# Patient Record
Sex: Female | Born: 1945
Health system: Southern US, Community
[De-identification: ages and names within clinical notes are randomized; demographics above are authoritative.]

## PROBLEM LIST (undated history)

## (undated) DIAGNOSIS — I10 Essential (primary) hypertension: Secondary | ICD-10-CM

## (undated) DIAGNOSIS — E559 Vitamin D deficiency, unspecified: Secondary | ICD-10-CM

## (undated) DIAGNOSIS — R208 Other disturbances of skin sensation: Secondary | ICD-10-CM

## (undated) DIAGNOSIS — K219 Gastro-esophageal reflux disease without esophagitis: Secondary | ICD-10-CM

## (undated) HISTORY — DX: Gastro-esophageal reflux disease without esophagitis: K21.9

## (undated) HISTORY — PX: CHOLECYSTECTOMY: SHX55

## (undated) HISTORY — PX: CARDIAC CATHETERIZATION: SHX172

## (undated) HISTORY — DX: Vitamin D deficiency, unspecified: E55.9

## (undated) HISTORY — DX: Other disturbances of skin sensation: R20.8

---

## 2007-08-03 ENCOUNTER — Ambulatory Visit: Payer: Self-pay | Admitting: Internal Medicine

## 2007-08-14 ENCOUNTER — Ambulatory Visit (HOSPITAL_COMMUNITY): Admission: RE | Admit: 2007-08-14 | Discharge: 2007-08-14 | Payer: Self-pay | Admitting: Gastroenterology

## 2007-08-14 ENCOUNTER — Ambulatory Visit: Payer: Self-pay | Admitting: Gastroenterology

## 2007-08-14 ENCOUNTER — Encounter: Payer: Self-pay | Admitting: Gastroenterology

## 2007-08-14 HISTORY — PX: COLONOSCOPY: SHX174

## 2007-08-14 HISTORY — PX: ESOPHAGOGASTRODUODENOSCOPY: SHX1529

## 2007-09-25 ENCOUNTER — Ambulatory Visit: Payer: Self-pay | Admitting: Gastroenterology

## 2009-12-28 ENCOUNTER — Ambulatory Visit (HOSPITAL_COMMUNITY): Admission: RE | Admit: 2009-12-28 | Discharge: 2009-12-28 | Payer: Self-pay | Admitting: Internal Medicine

## 2010-02-05 ENCOUNTER — Ambulatory Visit (HOSPITAL_COMMUNITY): Admission: RE | Admit: 2010-02-05 | Discharge: 2010-02-05 | Payer: Self-pay | Admitting: Family Medicine

## 2011-02-01 NOTE — Consult Note (Signed)
NAME:  Anna Gibson, Anna Gibson                 ACCOUNT NO.:  [id number]   MEDICAL RECORD NO.:  [id number]          PATIENT TYPE:  AMB   LOCATION:  DAY                           FACILITY:  [facility]   PHYSICIAN:  R. Roetta Sessions, M.D. DATE OF BIRTH:  [birth year]   DATE OF CONSULTATION:  08/03/2007  DATE OF DISCHARGE:                                 CONSULTATION   PHYSICIAN REQUESTING CONSULTATION:  Yetta Numbers, MD.   REASON FOR CONSULTATION:  Abdominal pain, diarrhea, GERD.   HISTORY OF PRESENT ILLNESS:  Patient is a 65 year old Caucasian female  who presents for further evaluation of above-stated symptoms.  She  states she has over a 35-year history of chronic intermittent diarrhea.  Stools have altered between diarrhea and constipation for years.  She  has a long history of postprandial loose stools.  She also has a history  of having diarrhea with stress.  She states over the past several  months, however, her symptoms have been progressively worse.  She is  getting to the point she is afraid to go out.  She is having 5 and 6  stools a day.  Denies any nocturnal diarrhea.  Denies any melena or  rectal bleeding.  Her husband notes that if she is stressed out she  tends to have an upset stomach and has diarrhea.  He also states after  eating lots of tomatoes over the summer her symptoms seem to be worse.  There are certain restaurants that she cannot eat at without having to  run and have diarrhea.  She recently tried Bentyl which worked for a  couple of days but then she says her symptoms got worse so she stopped  it.  She also has chronic GERD.  She previously did very well on  Prilosec but then lately she has noticed it was causing her stomach to  hurt and she stopped the medication.  Previously, she had diarrhea with  Nexium.  She took Zegerid for a few days last week and really did not  notice any improvement of her GERD and states it caused her to have  abdominal pain.  In the past she took  Prevacid without any problems.  She also did well on Aciphex in the past.  She complains of dysphagia  and odynophagia at times.  Not particularly related to any certain food.  Denies any nausea or vomiting or unintentional weight loss.  She has  never had an EGD or colonoscopy.  She collected stools for studies and  turned them in yesterday but results are pending.  Back in August she  had labs with a white count of 3500, otherwise CBC was normal, LFTs, MET  7 were normal, TSH was 3.206, normal.   CURRENT MEDICATIONS:  1. Ziac 5 mg daily.  2. Imodium p.r.n.   ALLERGIES:  INDOCIN.   PAST MEDICAL HISTORY:  1. Hypertension.  2. Osteoarthritis.  3. Chronic GERD.  4. Cesarean section x2.  5. Cholecystectomy in 2001.   FAMILY HISTORY:  1. Mother 62, she has heart disease and stomach problems including  hernias and has had her gallbladder removed.  2. Her sister had bladder cancer.  3. Fraternal grandmother had colon cancer in her 9s or 72s and      succumbed to other illnesses at age  81.   SOCIAL HISTORY:  1. She is married.  2. She has 2 children.  3. 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  4. Never been a smoker.  5. No alcohol use.   REVIEW OF SYSTEMS:  See HPI for GI.  CONSTITUTIONAL AND CARDIOPULMONARY:  Denies chest pain or shortness of breath.   PHYSICAL EXAM:  Weight 165, height 5 feet 4 inches, temp 98.4, blood  pressure 120/88, pulse 60.  GENERAL:  A pleasant, well-nourished, well-developed Caucasian female in  no acute distress.  Skin warm and dry, no jaundice.  HEENT:  Sclera nonicteric, oropharyngeal mucosa moist and pink, no  lesions, erythema or exudate.  No lymphadenopathy or thyromegaly.  CHEST:  Lungs clear to auscultation.  CARDIAC EXAM:  A regular rate and rhythm, normal S1 S2, no murmurs, rubs  or gallops.  ABDOMEN:  Positive bowel sounds.  Abdomen soft, nondistended.  She has  some fullness just  below the umbilicus in right of midline but no  discrete mass noted, it is somewhat tender in this area.  No  organomegaly, abdominal bruits or hernias.  LOWER EXTREMITIES:  No edema.   IMPRESSION:  Anna Gibson is a 65 year old lady with chronic intermittent  diarrhea with alternating constipation at times.  Lately her diarrhea  has been worse.  Symptoms are definitely worse postprandially and with  stress.  She has had worsening diarrhea with certain proton pump  inhibitors including Nexium and Zegerid.  She has chronic  gastroesophageal reflux disease which is well controlled on Prilosec,  but she recently stopped this due to abdominal pain.  Suspect we may be  dealing with irritable bowel syndrome; however, given that her symptoms  have been progressively worsened, we need to consider other etiologies  including irritable bowel disease, celiac disease, colorectal cancer,  infectious colitis.  She complains of dysphagia, odynophagia and may  develop some esophageal stricture.  On exam she has some fullness just  right of midline below the umbilicus.  No discrete mass.  Will have Dr.  Jena Gauss examine the patient and make further recommendations, especially  if her procedures are unremarkable.   PLAN:  1. Colonoscopy and EGD with Dr. Jena Gauss in the near future.  2. Patient to be examined by Dr. Jena Gauss at time of endoscopy and      further recommendations to be made whether or not she needs to have      a CT of the abdomen to further evaluate fullness in the abdomen as      noted above.  3. Prevacid 30 mg daily, #20, samples given.  4. Will retrieve stool study results as they are available next week.  5. She may continue to use Imodium 2 mg t.i.d. p.r.n. for now.   I would like to thank Dr. Yetta Numbers for allowing [place] to take part in  the care of this patient.      Tana Coast, P.AJonathon Bellows, M.D.  Electronically Signed    LL/MEDQ  D:  08/03/2007  T:  08/03/2007   Job:  [id number]   cc:   Kirk Ruths, M.D.  Fax: [phone]

## 2011-02-01 NOTE — Assessment & Plan Note (Signed)
NAMEHONORA, Anna Gibson                    CHART#:  [id number]   DATE:  09/25/2007                       DOB:  1946-04-21   REFERRING PHYSICIAN:  Kirk Ruths, M.D.   PROBLEM LIST:  1. Abdominal pain and diarrhea likely secondary to irritable bowel      syndrome.  2. Hypertension.  3. Cholecystectomy.   SUBJECTIVE:  Anna Gibson is a 65 year old female who presents as a return  patient visit.  She was last seen in 07/2007 for colonoscopy and an  upper endoscopy.  Colonoscopy only revealed diverticulosis and her upper  endoscopy revealed multiple benign polyps and no evidence of h.pylori  gastritis or celiac sprue. She previously had negative C. diff and  routine stool culture in 07/2007.  She believes she can control her  symptoms with management on her part.  She does notice that if she  goes long periods of time without eating her diarrhea is worse.  If she  eats just a bite during the daytime then she has no problems.  She has  had improvement in her abdominal pain since being on omeprazole.  She  has two bowel movements a day but her number of bowel movements per day  is influenced by what she eats.   MEDICATIONS:  1. Ziac 5 mg daily.  2. Omeprazole 20 mg daily.  3. Imodium as needed.   OBJECTIVE:  VITALS:  Weight 166 pounds (unchanged since 07/2007), height  5 feet 4 inches, body mass index 28.5 (overweight).  GENERAL: She is in no apparent distress. Alert and oriented x4.  LUNGS:  Clear to auscultation bilaterally.  CARDIOVASCULAR:  Regular rhythm, no murmur.  ABDOMEN:  Bowel sounds are present, soft, nontender, nondistended.   ASSESSMENT:  Anna Gibson is a 65 year old female with abdominal pain and  diarrhea.  The abdominal pain has resolved with Prilosec.  She did have  evidence of erosions in her antrum on the upper endoscopy.  Her diarrhea  is likely secondary to a functional gut disorder.   DISCHARGE DIAGNOSIS:  Includes a low likelihood of bowel salt induced  diarrhea.  Thank you for allowing me to see Anna Gibson in consultation.  My recommendations follow.   RECOMMENDATIONS:  1. Anna Gibson may use Yoplait daily or digestive advantage irritable      bowel syndrome.  She was given 16 samples.  2. She should continue the omeprazole.  3. She may follow-up with me as needed.       Kassie Mends, M.D.  Electronically Signed     SM/MEDQ  D:  09/25/2007  T:  09/26/2007  Job:  [id number]   cc:   Kirk Ruths, M.D.

## 2011-02-01 NOTE — Op Note (Signed)
NAMEDESMOND, TUFANO                 ACCOUNT NO.:  [id number]   MEDICAL RECORD NO.:  [id number]          PATIENT TYPE:  AMB   LOCATION:  DAY                           FACILITY:  [facility]   PHYSICIAN:  Kassie Mends, M.D.      DATE OF BIRTH:  1946-02-22   DATE OF PROCEDURE:  08/14/2007  DATE OF DISCHARGE:                               OPERATIVE REPORT   REFERRING PHYSICIAN:  Kirk Ruths, M.D.   PROCEDURE:  1. Colonoscopy with cold forceps biopsy.  2. Esophagogastroduodenoscopy with cold forceps biopsy.   INDICATION FOR EXAM:  Anna Gibson is a 65 year old female who presents  with chronic intermittent diarrhea.  She is also complaining of  abdominal pain which is new.  It started back in the summer when she was  eating lots of garden-fresh tomatoes.  She has been tried on Nexium and  Zegerid which did not improve her symptoms.  She has had these problems  in the past to control symptoms of gastroesophageal reflux disease, but  is not taking it regularly.   FINDINGS:  1. Rare sigmoid colon diverticula.  Random biopsies obtained in the      colon to evaluate for microscopic colitis.  Otherwise no masses,      inflammatory changes, AVMs, or internal hemorrhoids seen.  2. A 2 cm hiatal hernia.  Multiple sessile gastric polyp seen in the      fundus.  Biopsies obtained via cold forceps.  3. Multiple erosions seen in the antrum with evidence of old blood.      No ulcerations.  Biopsies obtained to evaluate for H. pylori      gastritis.  4. Normal duodenum.  Biopsies obtained to evaluate for celiac sprue      due to her history of chronic diarrhea.   DIAGNOSIS:  Gastritis is the likely cause for her abdominal pain.   RECOMMENDATIONS:  1. Continue Prilosec daily.  She is given a prescription.  We will      call Ms. Ivie with the results of her biopsies.  2. No aspirin, NSAIDs or anticoagulation for 7 days.  3. She should avoid gastric irritants.  She is given a handout on  gastric irritants and gastritis.  She should also follow a high      fiber diet.  She is given a handout on diverticulosis and a high      fiber diet.  4. She has a follow up appointment to see me in 6 weeks to reevaluate      her abdominal pain and diarrhea.  5. Screening colonoscopy in 10 years with osmo prep.  6. Will call with biopsy report. If symptoms not improved  at [facility] then      consider addition of anticholinergics.   PROCEDURE TECHNIQUE:  Physical exam was performed and informed consent  was obtained from the patient after explaining the benefits, risks, and  alternatives to the procedure.  The patient was connected to the  monitor, and placed in the left lateral position.  Continuous oxygen was  provided by nasal cannula and IV medicine  administered through an  indwelling cannula.  After administration of sedation and rectal exam,  the patient's rectum was intubated; and the scope was advanced under  direct visualization to cecum.  The scope was removed slowly by  carefully examining the color, texture, anatomy, and integrity mucosa on  the way out.   After the colonoscopy, the patient's esophagus was intubated with a  diagnostic gastroscope, and the scope was advanced under direct  visualization to the second portion of the duodenum.  The scope was  removed slowly by carefully examining the color, texture, anatomy, and  integrity of the mucosa on the way out.  The patient was recovered in  endoscopy and discharged home in satisfactory condition.      Kassie Mends, M.D.  Electronically Signed     SM/MEDQ  D:  08/14/2007  T:  08/14/2007  Job:  [id number]   cc:   Kirk Ruths, M.D.  Fax: [phone]

## 2011-09-29 ENCOUNTER — Encounter (HOSPITAL_COMMUNITY): Payer: Self-pay | Admitting: *Deleted

## 2011-09-29 ENCOUNTER — Emergency Department (HOSPITAL_COMMUNITY): Payer: Managed Care, Other (non HMO)

## 2011-09-29 ENCOUNTER — Encounter (HOSPITAL_COMMUNITY)
Admission: EM | Disposition: A | Discharge status: Home / Self Care | Payer: Self-pay | Source: Home / Self Care | Attending: Cardiovascular Disease

## 2011-09-29 ENCOUNTER — Other Ambulatory Visit: Payer: Self-pay

## 2011-09-29 ENCOUNTER — Inpatient Hospital Stay (HOSPITAL_COMMUNITY)
Admission: EM | Admit: 2011-09-29 | Discharge: 2011-09-30 | DRG: 287 | Disposition: A | Discharge status: Home / Self Care | Payer: Managed Care, Other (non HMO) | Attending: Cardiovascular Disease | Admitting: Cardiovascular Disease

## 2011-09-29 DIAGNOSIS — R079 Chest pain, unspecified: Secondary | ICD-10-CM

## 2011-09-29 DIAGNOSIS — R0789 Other chest pain: Principal | ICD-10-CM | POA: Diagnosis present

## 2011-09-29 DIAGNOSIS — I10 Essential (primary) hypertension: Secondary | ICD-10-CM | POA: Diagnosis present

## 2011-09-29 DIAGNOSIS — I200 Unstable angina: Secondary | ICD-10-CM

## 2011-09-29 HISTORY — DX: Essential (primary) hypertension: I10

## 2011-09-29 HISTORY — PX: LEFT HEART CATHETERIZATION WITH CORONARY ANGIOGRAM: SHX5451

## 2011-09-29 HISTORY — DX: Chest pain, unspecified: R07.9

## 2011-09-29 LAB — COMPREHENSIVE METABOLIC PANEL
Albumin: 4.1 g/dL (ref 3.5–5.2)
BUN: 13 mg/dL (ref 6–23)
CO2: 27 mEq/L (ref 19–32)
Chloride: 102 mEq/L (ref 96–112)
Creatinine, Ser: 0.65 mg/dL (ref 0.50–1.10)
Glucose, Bld: 117 mg/dL — ABNORMAL HIGH (ref 70–99)
Sodium: 138 mEq/L (ref 135–145)

## 2011-09-29 LAB — HEMOGLOBIN A1C
Hgb A1c MFr Bld: 5.7 % — ABNORMAL HIGH (ref <5.7)
Mean Plasma Glucose: 117 mg/dL — ABNORMAL HIGH (ref <117)

## 2011-09-29 LAB — CBC
HCT: 36.6 % (ref 36.0–46.0)
MCH: 29.9 pg (ref 26.0–34.0)
MCHC: 33.6 g/dL (ref 30.0–36.0)
Platelets: 242 10*3/uL (ref 150–400)
RDW: 12.7 % (ref 11.5–15.5)
WBC: 4.1 10*3/uL (ref 4.0–10.5)

## 2011-09-29 LAB — LIPID PANEL: Total CHOL/HDL Ratio: 3.2 RATIO

## 2011-09-29 LAB — TSH: TSH: 2.333 u[IU]/mL (ref 0.350–4.500)

## 2011-09-29 LAB — PROTIME-INR
INR: 1.01 (ref 0.00–1.49)
Prothrombin Time: 13.5 seconds (ref 11.6–15.2)

## 2011-09-29 LAB — APTT: aPTT: 104 seconds — ABNORMAL HIGH (ref 24–37)

## 2011-09-29 LAB — MAGNESIUM: Magnesium: 1.4 mg/dL — ABNORMAL LOW (ref 1.5–2.5)

## 2011-09-29 LAB — CARDIAC PANEL(CRET KIN+CKTOT+MB+TROPI)
Relative Index: INVALID (ref 0.0–2.5)
Total CK: 68 U/L (ref 7–177)

## 2011-09-29 SURGERY — LEFT HEART CATHETERIZATION WITH CORONARY ANGIOGRAM
Anesthesia: LOCAL

## 2011-09-29 MED ORDER — MORPHINE SULFATE 4 MG/ML IJ SOLN
4.0000 mg | Freq: Once | INTRAMUSCULAR | Status: AC
  Administered 2011-09-29: 4 mg via INTRAVENOUS

## 2011-09-29 MED ORDER — ROSUVASTATIN CALCIUM 10 MG PO TABS
10.0000 mg | ORAL_TABLET | Freq: Every day | ORAL | Status: DC
  Filled 2011-09-29: qty 1

## 2011-09-29 MED ORDER — SODIUM CHLORIDE 0.9 % IV SOLN: 250.0000 mL | INTRAVENOUS | Status: DC | PRN

## 2011-09-29 MED ORDER — ONDANSETRON HCL 4 MG/2ML IJ SOLN
INTRAMUSCULAR | Status: AC
  Filled 2011-09-29: qty 2

## 2011-09-29 MED ORDER — ALPRAZOLAM 0.25 MG PO TABS: 0.2500 mg | ORAL_TABLET | Freq: Two times a day (BID) | ORAL | Status: DC | PRN

## 2011-09-29 MED ORDER — ONDANSETRON HCL 4 MG/2ML IJ SOLN: 4.0000 mg | Freq: Four times a day (QID) | INTRAMUSCULAR | Status: DC | PRN

## 2011-09-29 MED ORDER — ACETAMINOPHEN 325 MG PO TABS: 650.0000 mg | ORAL_TABLET | ORAL | Status: DC | PRN

## 2011-09-29 MED ORDER — PROMETHAZINE HCL 25 MG/ML IJ SOLN
12.5000 mg | Freq: Four times a day (QID) | INTRAMUSCULAR | Status: DC | PRN
  Administered 2011-09-29: 12.5 mg via INTRAVENOUS
  Filled 2011-09-29 (×2): qty 1

## 2011-09-29 MED ORDER — NITROGLYCERIN IN D5W 200-5 MCG/ML-% IV SOLN
5.0000 ug/min | Freq: Once | INTRAVENOUS | Status: AC
  Administered 2011-09-29: 5 ug/min via INTRAVENOUS
  Filled 2011-09-29: qty 250

## 2011-09-29 MED ORDER — LIDOCAINE HCL (PF) 1 % IJ SOLN
INTRAMUSCULAR | Status: AC
  Filled 2011-09-29: qty 30

## 2011-09-29 MED ORDER — SODIUM CHLORIDE 0.9 % IV SOLN
INTRAVENOUS | Status: DC
  Administered 2011-09-29: 10:00:00 via INTRAVENOUS

## 2011-09-29 MED ORDER — ASPIRIN 325 MG PO TABS
325.0000 mg | ORAL_TABLET | Freq: Once | ORAL | Status: AC
  Administered 2011-09-29: 325 mg via ORAL
  Filled 2011-09-29: qty 1

## 2011-09-29 MED ORDER — MORPHINE SULFATE 4 MG/ML IJ SOLN
4.0000 mg | Freq: Once | INTRAMUSCULAR | Status: AC
  Administered 2011-09-29: 4 mg via INTRAVENOUS
  Filled 2011-09-29: qty 1

## 2011-09-29 MED ORDER — ASPIRIN EC 81 MG PO TBEC: 81.0000 mg | DELAYED_RELEASE_TABLET | Freq: Every day | ORAL | Status: DC

## 2011-09-29 MED ORDER — MAGNESIUM SULFATE 40 MG/ML IJ SOLN
2.0000 g | Freq: Once | INTRAMUSCULAR | Status: AC
  Administered 2011-09-29: 2 g via INTRAVENOUS
  Filled 2011-09-29: qty 50

## 2011-09-29 MED ORDER — NITROGLYCERIN IN D5W 200-5 MCG/ML-% IV SOLN: 3.0000 ug/min | INTRAVENOUS | Status: DC

## 2011-09-29 MED ORDER — HEPARIN SOD (PORCINE) IN D5W 100 UNIT/ML IV SOLN
INTRAVENOUS | Status: AC
  Filled 2011-09-29: qty 250

## 2011-09-29 MED ORDER — ASPIRIN 81 MG PO CHEW
324.0000 mg | CHEWABLE_TABLET | ORAL | Status: AC
  Administered 2011-09-29: 324 mg via ORAL
  Filled 2011-09-29: qty 4

## 2011-09-29 MED ORDER — ACETAMINOPHEN 325 MG PO TABS
650.0000 mg | ORAL_TABLET | ORAL | Status: DC | PRN
  Administered 2011-09-29 – 2011-09-30 (×2): 650 mg via ORAL
  Filled 2011-09-29 (×2): qty 2

## 2011-09-29 MED ORDER — HEPARIN SOD (PORCINE) IN D5W 100 UNIT/ML IV SOLN
1050.0000 [IU]/h | INTRAVENOUS | Status: DC
  Filled 2011-09-29: qty 250

## 2011-09-29 MED ORDER — DIAZEPAM 5 MG PO TABS: 5.0000 mg | ORAL_TABLET | ORAL | Status: DC

## 2011-09-29 MED ORDER — BUTALBITAL-APAP-CAFFEINE 50-325-40 MG PO TABS
1.0000 | ORAL_TABLET | ORAL | Status: DC | PRN
  Filled 2011-09-29: qty 1

## 2011-09-29 MED ORDER — NITROGLYCERIN 0.2 MG/ML ON CALL CATH LAB
INTRAVENOUS | Status: AC
  Filled 2011-09-29: qty 1

## 2011-09-29 MED ORDER — MORPHINE SULFATE 2 MG/ML IJ SOLN
2.0000 mg | INTRAMUSCULAR | Status: DC | PRN
  Administered 2011-09-29: 2 mg via INTRAVENOUS
  Filled 2011-09-29: qty 1

## 2011-09-29 MED ORDER — ZOLPIDEM TARTRATE 5 MG PO TABS: 10.0000 mg | ORAL_TABLET | Freq: Every evening | ORAL | Status: DC | PRN

## 2011-09-29 MED ORDER — HEPARIN BOLUS VIA INFUSION
4000.0000 [IU] | Freq: Once | INTRAVENOUS | Status: AC
  Administered 2011-09-29: 4000 [IU] via INTRAVENOUS

## 2011-09-29 MED ORDER — MORPHINE SULFATE 4 MG/ML IJ SOLN
INTRAMUSCULAR | Status: AC
  Administered 2011-09-29: 4 mg via INTRAVENOUS
  Filled 2011-09-29: qty 1

## 2011-09-29 MED ORDER — NITROGLYCERIN 0.4 MG SL SUBL: 0.4000 mg | SUBLINGUAL_TABLET | Freq: Once | SUBLINGUAL | Status: DC

## 2011-09-29 MED ORDER — ONDANSETRON HCL 4 MG/2ML IJ SOLN
4.0000 mg | Freq: Once | INTRAMUSCULAR | Status: AC
  Administered 2011-09-29: 4 mg via INTRAVENOUS

## 2011-09-29 MED ORDER — HEPARIN (PORCINE) IN NACL 100-0.45 UNIT/ML-% IJ SOLN
14.0000 [IU]/kg/h | Freq: Once | INTRAMUSCULAR | Status: AC
  Administered 2011-09-29: 14 [IU]/kg/h via INTRAVENOUS

## 2011-09-29 MED ORDER — METOPROLOL TARTRATE 25 MG PO TABS
25.0000 mg | ORAL_TABLET | Freq: Every day | ORAL | Status: DC
  Administered 2011-09-29 – 2011-09-30 (×2): 25 mg via ORAL
  Filled 2011-09-29: qty 1

## 2011-09-29 MED ORDER — SODIUM CHLORIDE 0.9 % IJ SOLN: 3.0000 mL | Freq: Two times a day (BID) | INTRAMUSCULAR | Status: DC

## 2011-09-29 MED ORDER — ONDANSETRON HCL 4 MG/2ML IJ SOLN
4.0000 mg | Freq: Once | INTRAMUSCULAR | Status: AC
Start: 2011-09-29 — End: 2011-09-29
  Administered 2011-09-29: 4 mg via INTRAVENOUS

## 2011-09-29 MED ORDER — ASPIRIN 81 MG PO CHEW: 324.0000 mg | CHEWABLE_TABLET | ORAL | Status: DC

## 2011-09-29 MED ORDER — METOPROLOL TARTRATE 12.5 MG HALF TABLET
12.5000 mg | ORAL_TABLET | Freq: Two times a day (BID) | ORAL | Status: DC
  Filled 2011-09-29 (×2): qty 1

## 2011-09-29 MED ORDER — SODIUM CHLORIDE 0.9 % IJ SOLN: 3.0000 mL | INTRAMUSCULAR | Status: DC | PRN

## 2011-09-29 MED ORDER — SODIUM CHLORIDE 0.9 % IV SOLN
INTRAVENOUS | Status: AC
  Administered 2011-09-29: 16:00:00 via INTRAVENOUS

## 2011-09-29 MED ORDER — HEPARIN (PORCINE) IN NACL 2-0.9 UNIT/ML-% IJ SOLN
INTRAMUSCULAR | Status: AC
  Filled 2011-09-29: qty 2000

## 2011-09-29 MED ORDER — ASPIRIN 300 MG RE SUPP
300.0000 mg | RECTAL | Status: DC
  Filled 2011-09-29: qty 1

## 2011-09-29 MED ORDER — ONDANSETRON HCL 4 MG/2ML IJ SOLN
4.0000 mg | Freq: Four times a day (QID) | INTRAMUSCULAR | Status: DC | PRN
  Administered 2011-09-29: 4 mg via INTRAVENOUS

## 2011-09-29 MED ORDER — ONDANSETRON HCL 4 MG/2ML IJ SOLN
INTRAMUSCULAR | Status: AC
  Administered 2011-09-29: 4 mg via INTRAVENOUS
  Filled 2011-09-29: qty 2

## 2011-09-29 MED ORDER — LORATADINE 10 MG PO TABS
10.0000 mg | ORAL_TABLET | Freq: Every day | ORAL | Status: DC
  Administered 2011-09-29 – 2011-09-30 (×2): 10 mg via ORAL
  Filled 2011-09-29 (×3): qty 1

## 2011-09-29 MED ORDER — NITROGLYCERIN 0.4 MG SL SUBL: 0.4000 mg | SUBLINGUAL_TABLET | SUBLINGUAL | Status: DC | PRN

## 2011-09-29 MED ORDER — PANTOPRAZOLE SODIUM 40 MG PO TBEC
40.0000 mg | DELAYED_RELEASE_TABLET | Freq: Every day | ORAL | Status: DC
  Administered 2011-09-29: 40 mg via ORAL
  Filled 2011-09-29: qty 1

## 2011-09-29 MED ORDER — NITROGLYCERIN 0.4 MG SL SUBL
0.4000 mg | SUBLINGUAL_TABLET | SUBLINGUAL | Status: DC | PRN
  Administered 2011-09-29: 0.4 mg via SUBLINGUAL
  Filled 2011-09-29: qty 25

## 2011-09-29 NOTE — Progress Notes (Signed)
ANTICOAGULATION CONSULT NOTE - Initial Consult  Pharmacy Consult for heparin Indication: UA  Allergies  Allergen Reactions  . Indocin     Patient Measurements: Height: 5\' 4"  (162.6 cm) Weight: 152 lb 1.9 oz (69 kg) IBW/kg (Calculated) : 54.7  Heparin wt = 68.6 Kg  Vital Signs: Temp: 98.1 F (36.7 C) (01/10 0436) Temp src: Oral (01/10 0436) BP: 129/77 mmHg (01/10 0900) Pulse Rate: 84  (01/10 0900)  Labs:  Basename 09/29/11 0507  HGB 12.3  HCT 36.6  PLT 242  APTT --  LABPROT 13.5  INR 1.01  HEPARINUNFRC --  CREATININE 0.65  CKTOTAL --  CKMB --  TROPONINI <0.30   Estimated Creatinine Clearance: 67.7 ml/min (by C-G formula based on Cr of 0.65).  Medical History: Past Medical History  Diagnosis Date  . Hypertension   . HTN (hypertension), though recently low BP 09/29/2011    Medications:  Scheduled:    . aspirin  324 mg Oral Pre-Cath  . aspirin EC  81 mg Oral Daily  . aspirin  325 mg Oral Once  . diazepam  5 mg Oral On Call  . heparin      . heparin  14 Units/kg/hr Intravenous Once  . heparin  4,000 Units Intravenous Once  . metoprolol tartrate  12.5 mg Oral BID  . morphine  4 mg Intravenous Once  . morphine  4 mg Intravenous Once  . nitroGLYCERIN  0.4 mg Sublingual Once  . nitroGLYCERIN  5-200 mcg/min Intravenous Once  . ondansetron      . ondansetron (ZOFRAN) IV  4 mg Intravenous Once  . ondansetron  4 mg Intravenous Once  . rosuvastatin  10 mg Oral q1800  . sodium chloride  3 mL Intravenous Q12H  . DISCONTD: aspirin  324 mg Oral NOW  . DISCONTD: aspirin  300 mg Rectal NOW    Assessment: 65 YOF tx from [facility] with chest pain and UA.  Was give heparin 4000 unit bolus and started at 1050 units/hr (at ~ 0520 this am). She is currently being prepped for the cath lab.    Goal of Therapy:  Heparin level 0.3-0.7 units/ml   Plan:  1. Although would have started her at 12 units/kg/hr, 15 units/kg/hr (1050 units/hr) is OK since next for cath. 2. F/u after  cath  Dannielle Huh 09/29/2011,10:10 AM

## 2011-09-29 NOTE — Consult Note (Signed)
  Pt was reexamined and existing H & P reviewed. No changes found.  [name], MD [facility] 09/29/2011 10:56 AM

## 2011-09-29 NOTE — ED Notes (Signed)
Pt vomiting at this time EDP made aware, orders received.

## 2011-09-29 NOTE — ED Notes (Signed)
Pt reports intermittent episodes of generalized cp for the past week, reports she saw PCP (Dr. Regino Schultze) last week and was in process of being referred to a cardiologist, pt reports pain got worse last night and has not stopped

## 2011-09-29 NOTE — ED Notes (Signed)
Pt reports having intermittent episodes of generalized chest pain for 1 week. Pt reports seeing PCP on Friday of last week and she was in the process of being referred to a cardiologist. Pt reports pain went away for approximately 3 hours last night and then came back and has became progressively worse. Pt describes the pain as a pressure stating that she feels "like something is sitting on my chest." pt denies any vomiting but states that she has felt nauseous. Pt also states that she has been stressed. Pt placed on cardiac monitor NSR at this time with no ectopy. Pt placed on 2L of oxygen. Pt alert and oriented at this time.

## 2011-09-29 NOTE — ED Provider Notes (Addendum)
History     CSN: [id number]  Arrival date & time 09/29/11  0425   First MD Initiated Contact with Patient 09/29/11 [phone]      Chief Complaint  Patient presents with  . Chest Pain     Patient is a 66 y.o. female presenting with chest pain.  Chest Pain    Patient reports one week of intermittent substernal chest pain/pressure that is worsened by exertion and walking around her house.  Now she reports she'll and walks a short distance before she develop shortness of breath and pressure in her chest.  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  She reports she has been unable to do this secondary to her discomfort. She has also had palpitations for several days.  She has no history of hyperlipidemia or diabetes.  She does have a history of hypertension.  She has a brother who reportedly has had several stents to his heart approximately 10-12 years ago.  She has no known history of coronary artery disease.  She reports she's never had these symptoms before.  She denies productive cough or fever or chills.  She denies melena or hematochezia.  At this time she is still having ongoing pressure in her chest has been going on for several hours.   Past Medical History  Diagnosis Date  . Hypertension     Past Surgical History  Procedure Date  . Cesarean section     No family history on file.  History  Substance Use Topics  . Smoking status: Never Smoker   . Smokeless tobacco: Not on file  . Alcohol Use: No    OB History    Grav Para Term Preterm Abortions TAB SAB Ect Mult Living                  Review of Systems  Cardiovascular: Positive for chest pain.  All other systems reviewed and are negative.    Allergies  Indocin  Home Medications  No current outpatient prescriptions on file.  BP 158/89  Pulse 72  Temp(Src) 98.1 F (36.7 C) (Oral)  Resp 18  Ht 5\' 4"  (1.626 m)  Wt 164 lb (74.39 kg)  BMI 28.15 kg/m2  SpO2  99%  Physical Exam  Nursing note and vitals reviewed. Constitutional: She is oriented to person, place, and time. She appears well-developed and well-nourished. No distress.  HENT:  Head: Normocephalic and atraumatic.  Eyes: EOM are normal.  Neck: Normal range of motion.  Cardiovascular: Normal rate, regular rhythm and normal heart sounds.   Pulmonary/Chest: Effort normal and breath sounds normal.  Abdominal: Soft. She exhibits no distension. There is no tenderness.  Musculoskeletal: Normal range of motion.  Neurological: She is alert and oriented to person, place, and time.  Skin: Skin is warm and dry.  Psychiatric: She has a normal mood and affect. Judgment normal.    ED Course  Procedures (including critical care time)   Date: 09/29/2011  Rate: 92  Rhythm: normal sinus rhythm  QRS Axis: normal  Intervals: normal  ST/T Wave abnormalities: nonspecific ST changes  Conduction Disutrbances:none  Narrative Interpretation:   Old EKG Reviewed: No prior ecg available  CRITICAL CARE Performed by: [name]  Total critical care time: 32  Critical care time was exclusive of separately billable procedures and treating other patients. Critical care was necessary to treat or prevent imminent or life-threatening deterioration. Critical care was time spent personally  by me on the following activities: development of treatment plan with patient and/or surrogate as well as nursing, discussions with consultants, evaluation of patient's response to treatment, examination of patient, obtaining history from patient or surrogate, ordering and performing treatments and interventions, ordering and review of laboratory studies, ordering and review of radiographic studies, pulse oximetry and re-evaluation of patient's condition.    Labs Reviewed  CBC  COMPREHENSIVE METABOLIC PANEL  TROPONIN I  PROTIME-INR   Dg Chest Portable 1 View  09/29/2011  *RADIOLOGY REPORT*  Clinical Data: Chest  pain for 1 week.  PORTABLE CHEST - 1 VIEW  Comparison: None.  Findings:  Cardiopericardial silhouette within normal limits. Mediastinal contours normal. Trachea midline.  No airspace disease or effusion. Monitoring leads are projected over the chest.  IMPRESSION: No active cardiopulmonary disease.  Original Report Authenticated By: Andreas Newport, M.D.   I personally reviewed the x-ray  1. Unstable angina       MDM  The patient's history is concerning for unstable angina.  And given an aspirin here in the nitroglycerin.  She'll be started on a heparin drip and nitroglycerin drip.  Labs and chest x-ray pending.  Her EKG is without significant ST changes.  5:56 AM Spoke with Karleen Hampshire of [facility] who accepts the patient in transfer to [facility] for Dr Allyson Sabal, cardiology.  Patient still having some pain at this time.  4 of IV morphine has been given and her nitroglycerin is being titrated up.  Her initial set of enzymes was normal  7:32 AM Patient reports her pain is much better at this time.  She's now complaining of mild headache.  Additional morphine given for her headache.  Her bed is ready and report his been called.  Awaiting transport by [facility] at this time     [name], MD 09/29/11 1610  [name], MD 09/29/11 [phone]

## 2011-09-29 NOTE — H&P (Signed)
Anna Gibson is an 66 y.o. female.    PRIMARY PHYSICIAN: Dr. Regino Schultze   Chief Complaint: chest pain. HPI:  66 YEAR OLD WMF, with no prior cardiac history, presented to [name] with chest pain.  Patient reports one week of intermittent substernal chest pain/pressure that is worsened by exertion and walking around her house. Now she reports she'll and walks a short distance before she develop shortness of breath and pressure in her chest. 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 She reports she has been unable to do this secondary to her discomfort. She has also had palpitations for several days. She has no history of hyperlipidemia or diabetes. She does have a history of hypertension. She has a brother who reportedly has had several stents to his heart approximately 10-12 years ago. She has no known history of coronary artery disease. She reports she's never had these symptoms before. She denies productive cough or fever or chills. She denies melena or hematochezia. At this time she is still having ongoing pressure in her chest has been going on for several hours.   Dr. Regino Schultze was to send her to cardiology but the symptoms increased before this was accomplished.  The symptoms increased last evening so she went to the ER.  Currently on IV Heparin and IV NTG, now with headache from the NTG.  EKG without acute changes, cardiac enzymes are negative currently.  + N&V possibly due to headache.  She denies any history of CVA.  No GI bleeding.    Past Medical History  Diagnosis Date  . Hypertension   . HTN (hypertension), though recently low BP 09/29/2011    Past Surgical History  Procedure Date  . Cesarean section   . Cesarean section     Family History  Problem Relation Age of Onset  . Congenital heart disease Mother   . Coronary artery disease Brother    Social History:  reports that she has never smoked. She has never used smokeless  tobacco. She reports that she does not drink alcohol or use illicit drugs.maried + children, works in a Public affairs consultant.  Allergies:  Allergies  Allergen Reactions  . Indocin     Medications Prior to Admission  Medication Dose Route Frequency Provider Last Rate Last Dose  . 0.9 %  sodium chloride infusion   Intravenous Continuous Leone Brand, NP 100 mL/hr at 09/29/11 0941    . 0.9 %  sodium chloride infusion  250 mL Intravenous PRN Leone Brand, NP      . acetaminophen (TYLENOL) tablet 650 mg  650 mg Oral Q4H PRN Leone Brand, NP      . ALPRAZolam Prudy Feeler) tablet 0.25 mg  0.25 mg Oral BID PRN Leone Brand, NP      . aspirin chewable tablet 324 mg  324 mg Oral NOW Leone Brand, NP       Or  . aspirin suppository 300 mg  300 mg Rectal NOW Leone Brand, NP      . aspirin EC tablet 81 mg  81 mg Oral Daily Leone Brand, NP      . aspirin tablet 325 mg  325 mg Oral Once [name], MD   325 mg at 09/29/11 0454  . heparin 100 UNIT/ML infusion           . heparin ADULT infusion 100 units/mL (25000 units/250 mL)  14 Units/kg/hr Intravenous Once [name], MD 10.5 mL/hr at 09/29/11 0521 14 Units/kg/hr at 09/29/11 0521  . heparin bolus via infusion 4,000 Units  4,000 Units Intravenous Once [name], MD   4,000 Units at 09/29/11 0520  . metoprolol tartrate (LOPRESSOR) tablet 12.5 mg  12.5 mg Oral BID Leone Brand, NP      . morphine 2 MG/ML injection 2 mg  2 mg Intravenous Q2H PRN Leone Brand, NP      . morphine 4 MG/ML injection 4 mg  4 mg Intravenous Once [name], MD   4 mg at 09/29/11 0547  . morphine 4 MG/ML injection 4 mg  4 mg Intravenous Once [name], MD   4 mg at 09/29/11 1610  . nitroGLYCERIN (NITROSTAT) SL tablet 0.4 mg  0.4 mg Sublingual Once [name], MD      . nitroGLYCERIN (NITROSTAT) SL tablet 0.4 mg  0.4 mg Sublingual Q5 min PRN Leone Brand, NP      . nitroGLYCERIN 0.2 mg/mL in dextrose 5 % infusion  5-200 mcg/min Intravenous Once  [name], MD 4.5 mL/hr at 09/29/11 0552 15 mcg/min at 09/29/11 0552  . nitroGLYCERIN 0.2 mg/mL in dextrose 5 % infusion  3-30 mcg/min Intravenous Titrated Leone Brand, NP      . ondansetron [employer]) 4 MG/2ML injection           . ondansetron (ZOFRAN) injection 4 mg  4 mg Intravenous Once [name], MD   4 mg at 09/29/11 9604  . ondansetron (ZOFRAN) injection 4 mg  4 mg Intravenous Once [name], MD   4 mg at 09/29/11 0731  . ondansetron (ZOFRAN) injection 4 mg  4 mg Intravenous Q6H PRN Leone Brand, NP      . rosuvastatin (CRESTOR) tablet 10 mg  10 mg Oral Daily Leone Brand, NP      . sodium chloride 0.9 % injection 3 mL  3 mL Intravenous Q12H Leone Brand, NP      . sodium chloride 0.9 % injection 3 mL  3 mL Intravenous PRN Leone Brand, NP      . zolpidem (AMBIEN) tablet 10 mg  10 mg Oral QHS PRN Leone Brand, NP      . DISCONTD: nitroGLYCERIN (NITROSTAT) SL tablet 0.4 mg  0.4 mg Sublingual Q5 min PRN [name], MD   0.4 mg at 09/29/11 0455  . DISCONTD: ondansetron (ZOFRAN) injection 4 mg  4 mg Intravenous Q6H PRN [name], MD   4 mg at 09/29/11 0859   No current outpatient prescriptions on file as of 09/29/2011.  OUTPATIENT MEDICATIONS: ZYRTEC 10 MG. DAILY ZANTAC 150 MG bid ZIAC WHICH SHE HAD STOPPED.  Results for orders placed during the hospital encounter of 09/29/11 (from the past 48 hour(s))  CBC     Status: Normal   Collection Time   09/29/11  5:07 AM      Component Value Range Comment   WBC 4.1  4.0 - 10.5 (K/uL)    RBC 4.12  3.87 - 5.11 (MIL/uL)    Hemoglobin 12.3  12.0 - 15.0 (g/dL)    HCT 54.0  98.1 - 19.1 (%)    MCV 88.8  78.0 - 100.0 (fL)    MCH 29.9  26.0 - 34.0 (pg)    MCHC 33.6  30.0 - 36.0 (g/dL)    RDW 47.8  29.5 - 62.1 (%)  Platelets 242  150 - 400 (K/uL)   COMPREHENSIVE METABOLIC PANEL     Status: Abnormal   Collection Time   09/29/11  5:07 AM      Component Value Range Comment   Sodium 138  135 - 145 (mEq/L)     Potassium 3.6  3.5 - 5.1 (mEq/L)    Chloride 102  96 - 112 (mEq/L)    CO2 27  19 - 32 (mEq/L)    Glucose, Bld 117 (*) 70 - 99 (mg/dL)    BUN 13  6 - 23 (mg/dL)    Creatinine, Ser 1.61  0.50 - 1.10 (mg/dL)    Calcium 09.6 (*) 8.4 - 10.5 (mg/dL)    Total Protein 7.1  6.0 - 8.3 (g/dL)    Albumin 4.1  3.5 - 5.2 (g/dL)    AST 17  0 - 37 (U/L)    ALT 9  0 - 35 (U/L)    Alkaline Phosphatase 103  39 - 117 (U/L)    Total Bilirubin 0.3  0.3 - 1.2 (mg/dL)    GFR calc non Af Amer >90  >90 (mL/min)    GFR calc Af Amer >90  >90 (mL/min)   TROPONIN I     Status: Normal   Collection Time   09/29/11  5:07 AM      Component Value Range Comment   Troponin I <0.30  <0.30 (ng/mL)   PROTIME-INR     Status: Normal   Collection Time   09/29/11  5:07 AM      Component Value Range Comment   Prothrombin Time 13.5  11.6 - 15.2 (seconds)    INR 1.01  0.00 - 1.49     Dg Chest Portable 1 View  09/29/2011  *RADIOLOGY REPORT*  Clinical Data: Chest pain for 1 week.  PORTABLE CHEST - 1 VIEW  Comparison: None.  Findings:  Cardiopericardial silhouette within normal limits. Mediastinal contours normal. Trachea midline.  No airspace disease or effusion. Monitoring leads are projected over the chest.  IMPRESSION: No active cardiopulmonary disease.  Original Report Authenticated By: Andreas Newport, M.D.    ROS: GENERAL: no colds of fevers  HEENT:no blurred vision, + headache after the NTG SKIN:no rashes or ulcers CARDIOVASCULAR:see HPI PULMONARY:DOE with chest pain. GI:no diarrhea, constipation, melena  GU:no hematuria, no dysuria NEURO:no syncope MS:no arthretic complaints ENDOCRINE:no diabetes, no thyroid disease.  Blood pressure 129/77, pulse 84, temperature 98.1 F (36.7 C), temperature source Oral, resp. rate 20, height 5\' 4"  (1.626 m), weight 69 kg (152 lb 1.9 oz), SpO2 98.00%. PE: General:A&O, pleasant affect, obviously does not feel well Skin:W&D, brisk capillary refill HEENT:normocephalic, sclera  clear. Neck:supple, no JVD, no bruits. Heart:S1S2, RRR, without murmur, gallup rub or click. Lungs:clear, without rales, rhonchi, or wheezes. EAV:WUJW, non-tender, + BS Ext:no edema,  + pulses. Neuro: A&O X 3 , MAE, follows commands, + facial symmetry.    Assessment/Plan Patient Active Problem List  Diagnoses  . Unstable angina  . HTN (hypertension), though recently low BP    PLAN: Continue IV Heparin, IV NTG, add IV morphine for further pain.  Add fioricet for headache.  Serial cardiac enzymes. Dr. Allyson Sabal has seen and examined the patient.  He discussed plan to proceed with cardiac cath with ongoing chest pain, and family hx. CAD.  Pt. And her family were agreeable to proceed.   INGOLD,LAURA R 09/29/2011, 9:42 AM   Agree with note written by Nada Boozer RNP  66 y/o <Wfemale admitted in transfer from [facility]  with symptoms c/w [place].  CRF + family history as well as HTN. Sx have been progressive and crescendo. Enz neg. EKG w/o acute changes. Exam benign. On iv hep/ntg. Best option is cardiac cath to definitively R/O CAD. Risks and benefits were explained to pt and family  [name] 09/29/2011 11:23 AM

## 2011-09-29 NOTE — Op Note (Signed)
Anna Gibson is a 66 y.o. female    [id number] LOCATION:  FACILITY: [facility]  PHYSICIAN: Nanetta Batty, M.D. [birth year]   DATE OF PROCEDURE:  09/29/2011  DATE OF DISCHARGE:  [facility]  CARDIAC CATHETERIZATION     History obtained from chart review.Patient reports one week of intermittent substernal chest pain/pressure that is worsened by exertion and walking around her house. Now she reports she'll and walks a short distance before she develop shortness of breath and pressure in her chest. 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 She reports she has been unable to do this secondary to her discomfort. She has also had palpitations for several days. She has no history of hyperlipidemia or diabetes. She does have a history of hypertension. She has a brother who reportedly has had several stents to his heart approximately 10-12 years ago. She has no known history of coronary artery disease. She reports she's never had these symptoms before. She denies productive cough or fever or chills. She denies melena or hematochezia. At this time she is still having ongoing pressure in her chest has been going on for several hours.     PROCEDURE DESCRIPTION:    The patient was brought to the second floor  [place] [facility]iac cath lab in the postabsorptive state. She was  premedicated with by mouth Valium. Her right groin was prepped and shaved in usual sterile fashion. Xylocaine 1% was used  for local anesthesia. A 5 French sheath was inserted into the right femoral  artery using standard Seldinger technique. 5 French right and left Judkins diagnostic catheters along the 5 French pigtail catheter were used for selective coronary angiography and left ventriculography respectively. Retrograde aortic and left ventricular pressures were recorded.    HEMODYNAMICS:    AO SYSTOLIC/AO DIASTOLIC: 134/77   LV SYSTOLIC/LV DIASTOLIC:  146/60  ANGIOGRAPHIC RESULTS:   1. Left main; normal though double-barreled  2. LAD; normal 3. Left circumflex; normal and dominant.  4. Right coronary artery; normal nondominant 5 Left ventriculography; RAO left ventriculogram was performed using  25 mL of Visipaque dye at 12 mL/second. The overall LVEF estimated  60 %  With/Without wall motion abnormalities  IMPRESSION:[name] has normal coronary arteries and normal ventricular function. I also performed supravalvular aortography ruling out aortic dissection. The etiology of her chest pain is still unclear. An ACT was measured and the sheath was removed. The patient left the lab in stable condition. She will be gently hydrated to remain recumbent for 4 hours. Keep her overnight and discharge her home in the morning.   [name] MD, [facility] 09/29/2011 11:34 AM

## 2011-09-29 NOTE — ED Notes (Signed)
Pt denies cp, c/o headache 8/10 pain with nausea.  Pt actively vomiting while RN in room.  edp notified, orders received.  Pt up to bedside commode with minimal assistance.  nad noted.  NSR on monitor.

## 2011-09-30 NOTE — Progress Notes (Signed)
[facility]  Subjective: Groin a little sore and she is a little short of breath.  Objective: Vital signs in last 24 hours: Temp:  [97.5 F (36.4 C)-98.7 F (37.1 C)] 98.7 F (37.1 C) (01/11 0803) Pulse Rate:  [60-91] 60  (01/11 0803) Resp:  [10-20] 11  (01/11 0803) BP: (109-143)/(58-80) 109/58 mmHg (01/11 0803) SpO2:  [96 %-100 %] 97 % (01/11 0803) Weight:  [69 kg (152 lb 1.9 oz)-74.1 kg (163 lb 5.8 oz)] 74.1 kg (163 lb 5.8 oz) (01/11 0516) Last BM Date: 09/28/11  Intake/Output from previous day: 01/10 0701 - 01/11 0700 In: 1335 [P.O.:580; I.V.:705; IV Piggyback:50] Out: 1650 [Urine:1650] Intake/Output this shift:    Medications Current Facility-Administered Medications  Medication Dose Route Frequency Provider Last Rate Last Dose  . 0.9 %  sodium chloride infusion   Intravenous Continuous [name], MD 75 mL/hr at 09/29/11 1533    . acetaminophen (TYLENOL) tablet 650 mg  650 mg Oral Q4H PRN [name], MD   650 mg at 09/30/11 0255  . aspirin chewable tablet 324 mg  324 mg Oral Pre-Cath Leone Brand, NP   324 mg at 09/29/11 1000  . heparin 100 UNIT/ML infusion           . heparin 2-0.9 UNIT/ML-% infusion           . lidocaine (XYLOCAINE) 1 % injection           . loratadine (CLARITIN) tablet 10 mg  10 mg Oral Daily [name], MD   10 mg at 09/29/11 1854  . magnesium sulfate IVPB 2 g 50 mL  2 g Intravenous Once Thurmon Fair, MD   2 g at 09/29/11 1939  . metoprolol tartrate (LOPRESSOR) tablet 25 mg  25 mg Oral Daily [name], MD   25 mg at 09/29/11 1421  . nitroGLYCERIN (NTG ON-CALL) 0.2 mg/mL injection           . ondansetron (ZOFRAN) 4 MG/2ML injection           . ondansetron (ZOFRAN) injection 4 mg  4 mg Intravenous Q6H PRN [name], MD      . pantoprazole (PROTONIX) EC tablet 40 mg  40 mg Oral Q1200 Leone Brand, NP   40 mg at 09/29/11 1856  . DISCONTD: 0.9 %  sodium chloride infusion   Intravenous  Continuous Leone Brand, NP 100 mL/hr at 09/29/11 0941    . DISCONTD: 0.9 %  sodium chloride infusion  250 mL Intravenous PRN Leone Brand, NP      . DISCONTD: acetaminophen (TYLENOL) tablet 650 mg  650 mg Oral Q4H PRN Leone Brand, NP      . DISCONTD: ALPRAZolam Prudy Feeler) tablet 0.25 mg  0.25 mg Oral BID PRN Leone Brand, NP      . DISCONTD: aspirin chewable tablet 324 mg  324 mg Oral NOW Leone Brand, NP      . DISCONTD: aspirin EC tablet 81 mg  81 mg Oral Daily Leone Brand, NP      . DISCONTD: aspirin suppository 300 mg  300 mg Rectal NOW Leone Brand, NP      . DISCONTD: butalbital-acetaminophen-caffeine (FIORICET, ESGIC) [phone] MG per tablet 1 tablet  1 tablet Oral Q4H PRN Leone Brand, NP      . DISCONTD: diazepam (VALIUM) tablet 5 mg  5 mg Oral On Call Leone Brand, NP      .  DISCONTD: heparin ADULT infusion 100 units/ml (25000 units/250 ml)  1,050 Units/hr Intravenous Continuous Dannielle Huh, PHARMD      . DISCONTD: metoprolol tartrate (LOPRESSOR) tablet 12.5 mg  12.5 mg Oral BID Leone Brand, NP      . DISCONTD: morphine 2 MG/ML injection 2 mg  2 mg Intravenous Q2H PRN Leone Brand, NP   2 mg at 09/29/11 1001  . DISCONTD: nitroGLYCERIN (NITROSTAT) SL tablet 0.4 mg  0.4 mg Sublingual Once [name], MD      . DISCONTD: nitroGLYCERIN (NITROSTAT) SL tablet 0.4 mg  0.4 mg Sublingual Q5 min PRN Leone Brand, NP      . DISCONTD: nitroGLYCERIN 0.2 mg/mL in dextrose 5 % infusion  3-30 mcg/min Intravenous Titrated Leone Brand, NP   15 mcg/min at 09/29/11 0941  . DISCONTD: ondansetron (ZOFRAN) injection 4 mg  4 mg Intravenous Q6H PRN [name], MD   4 mg at 09/29/11 0859  . DISCONTD: ondansetron (ZOFRAN) injection 4 mg  4 mg Intravenous Q6H PRN Leone Brand, NP      . DISCONTD: promethazine (PHENERGAN) injection 12.5 mg  12.5 mg Intravenous Q6H PRN Leone Brand, NP   12.5 mg at 09/29/11 1031  . DISCONTD: rosuvastatin (CRESTOR) tablet 10 mg  10 mg  Oral q1800 Leone Brand, NP      . DISCONTD: sodium chloride 0.9 % injection 3 mL  3 mL Intravenous Q12H Leone Brand, NP      . DISCONTD: sodium chloride 0.9 % injection 3 mL  3 mL Intravenous PRN Leone Brand, NP      . DISCONTD: zolpidem (AMBIEN) tablet 10 mg  10 mg Oral QHS PRN Leone Brand, NP        PE: General appearance: alert, cooperative and no distress Lungs: clear to auscultation bilaterally Heart: regular rate and rhythm, S1, S2 normal, no murmur, click, rub or gallop Extremities: No LEE Pulses: 2+ and symmetric Skin: Skin color, texture, turgor normal. No rashes or lesions or right groi, : small hematoma,  minimal ecchymosis.  no bruit.  Lab Results:   [facility] 09/29/11 0507  WBC 4.1  HGB 12.3  HCT 36.6  PLT 242   BMET  Basename 09/29/11 0507  NA 138  K 3.6  CL 102  CO2 27  GLUCOSE 117*  BUN 13  CREATININE 0.65  CALCIUM 11.5*   PT/INR  Basename 09/29/11 0936 09/29/11 0507  LABPROT 14.3 13.5  INR 1.09 1.01   Cholesterol  Basename 09/29/11 0936  CHOL 237*     Assessment/Plan  Principal Problem:  *Chest pain, increasing over several days.  Patent coronary arteries by cardiac cath 09/28/10 Active Problems:  HTN (hypertension), though recently low BP  Plan:  Normal coronaries by cath, 09/29/11. No further CP.  Cardiac enzymes negative.  DC home today. BP, HR, Cr stable and controlled.   LOS: 1 day    HAGER,BRYAN W 09/30/2011 8:52 AM  I have seen and examined the patient along with HAGER,BRYAN W, PA.  I have reviewed the chart, notes and new data.  I agree with PA's note.  Key new complaints: Slight groin soreness Key examination changes: no groin hematoma or abnormal pulsatility or bruit  Key new findings / data: note hypercalcemia without clear cause. Hypomagnesemia.  PLAN: DC home Outpt echo to complete workup for dyspnea. Refer to endocrinologist or nephrologist for hypercalcemia workup.  Thurmon Fair, MD,  [facility] [phone] 09/30/2011, 9:43 AM

## 2011-09-30 NOTE — Discharge Summary (Signed)
Physician Discharge Summary  Patient ID: Anna Gibson MRN: [id number] DOB/AGE: [birth year] 66 y.o.  Admit date: 09/29/2011 Discharge date: 09/30/2011  Admission Diagnoses:  Discharge Diagnoses:  Principal Problem:  *Chest pain, increasing over several days.  Patent coronary arteries by cardiac cath 09/28/10 Active Problems:  HTN (hypertension), though recently low BP  Hypercalcemia   Discharged Condition: stable  Hospital Course:  Patient reported one week of intermittent substernal chest pain/pressure that is worsened by exertion and walking around her house. Now she reports she'll and walks a short distance before she develop shortness of breath and pressure in her chest. 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 She reports she has been unable to do this secondary to her discomfort. She also reported palpitations for several days. She has no history of hyperlipidemia or diabetes. She does have a history of hypertension. She has no known history of coronary artery disease. She reported never having these symptoms before.   Dr. Regino Schultze was to send her to cardiology but the symptoms increased before this was accomplished. The patient was started on IV Heparin and IV NTG.  EKG without acute changes, cardiac enzymes were negative.  She was schedule for a left heart catheterization which revealed normal coronary arteries and an EF of 60% and no dissection.  The patient has been seen by Dr. Royann Shivers who feels she is ready for DC home.  She will have an outpatient 2D echo and will need follow up for elevated calcium(11.5).    Significant Diagnostic Studies:  Left heart cath HEMODYNAMICS:  AO SYSTOLIC/AO DIASTOLIC: 134/77  LV SYSTOLIC/LV DIASTOLIC: 146/60  ANGIOGRAPHIC RESULTS:  1. Left main; normal though double-barreled  2. LAD; normal  3. Left circumflex; normal and dominant.  4. Right coronary artery; normal nondominant  5 Left  ventriculography; RAO left ventriculogram was performed using  25 mL of Visipaque dye at 12 mL/second. The overall LVEF estimated  60 % With/Without wall motion abnormalities  IMPRESSION:Ms. Fesler has normal coronary arteries and normal ventricular function. I also performed supravalvular aortography ruling out aortic dissection. The etiology of her chest pain is still unclear. An ACT was measured and the sheath was removed. The patient left the lab in stable condition. She will be gently hydrated to remain recumbent for 4 hours. Keep her overnight and discharge her home in the morning.   Discharge Exam: Blood pressure 109/58, pulse 60, temperature 98.7 F (37.1 C), temperature source Oral, resp. rate 11, height 5\' 4"  (1.626 m), weight 74.1 kg (163 lb 5.8 oz), SpO2 97.00%.   Disposition: Final discharge disposition not confirmed  Discharge Orders    Future Orders Please Complete By Expires   Diet - low sodium heart healthy      Increase activity slowly      Discharge instructions      Comments:   No lifting greater than five pounds or driving for three days.   Call MD for:  redness, tenderness, or signs of infection (pain, swelling, redness, odor or green/yellow discharge around incision site)        Medication List  As of 09/30/2011 10:15 AM   TAKE these medications         cetirizine 10 MG tablet   Commonly known as: ZYRTEC   Take 10 mg by mouth daily.      metoprolol tartrate 25 MG tablet   Commonly known as: LOPRESSOR   Take 25 mg by  mouth daily.      ranitidine 150 MG tablet   Commonly known as: ZANTAC   Take 150 mg by mouth 2 (two) times daily.           Follow-up Information    Follow up with [name], MD. (Our office will call you with appointment times.)    Contact information:   [address] [place] [address] [phone]          Signed: Dwana Melena 09/30/2011, 10:15 AM   Cc: Dr. Regino Schultze

## 2012-01-10 ENCOUNTER — Encounter: Payer: Self-pay | Admitting: Gastroenterology

## 2012-01-11 ENCOUNTER — Encounter: Payer: Self-pay | Admitting: Gastroenterology

## 2012-01-11 ENCOUNTER — Ambulatory Visit (INDEPENDENT_AMBULATORY_CARE_PROVIDER_SITE_OTHER): Payer: Managed Care, Other (non HMO) | Admitting: Gastroenterology

## 2012-01-11 VITALS — BP 143/88 | HR 64 | Temp 97.3°F | Ht 64.0 in | Wt 163.8 lb

## 2012-01-11 DIAGNOSIS — K589 Irritable bowel syndrome without diarrhea: Secondary | ICD-10-CM

## 2012-01-11 DIAGNOSIS — K219 Gastro-esophageal reflux disease without esophagitis: Secondary | ICD-10-CM | POA: Insufficient documentation

## 2012-01-11 DIAGNOSIS — R1310 Dysphagia, unspecified: Secondary | ICD-10-CM

## 2012-01-11 HISTORY — DX: Dysphagia, unspecified: R13.10

## 2012-01-11 MED ORDER — RANITIDINE HCL 150 MG PO TABS: 150.0000 mg | ORAL_TABLET | Freq: Two times a day (BID) | ORAL | Status: DC

## 2012-01-11 MED ORDER — OMEPRAZOLE 20 MG PO CPDR: DELAYED_RELEASE_CAPSULE | ORAL | Status: DC

## 2012-01-11 NOTE — Assessment & Plan Note (Signed)
SX EXACERBATED BY STRESS, DIET, AND ABX.  ADD A PROBIOTIC. OPV IN 3 MOS.

## 2012-01-11 NOTE — Assessment & Plan Note (Signed)
INTERMITTENT, LIKELY DUE TO UNCONTROLLED GERD, LESS LIKELY ESO MOTILITY DISORDER, OR STRICTURE.  PT DECLINED EGD/DIL AT THIS TIME. OPV IN 3 MOS.

## 2012-01-11 NOTE — Progress Notes (Signed)
Faxed to PCP

## 2012-01-11 NOTE — Patient Instructions (Addendum)
TAKE PROBIOTIC DAILY([employer], [employer], OR ALIGN).  USE OMEPRAZOLE ALTERNATING WITH RANITIDINE.  LOSE 10 LBS.  FOLLOW A LOW FAT DIET. SEE INFO BELOW.  FOLLOW UP IN 3 MOS.  Low-Fat Diet BREADS, CEREALS, PASTA, RICE, DRIED PEAS, AND BEANS These products are high in carbohydrates and most are low in fat. Therefore, they can be increased in the diet as substitutes for fatty foods. They too, however, contain calories and should not be eaten in excess. Cereals can be eaten for snacks as well as for breakfast.  Include foods that contain fiber (fruits, vegetables, whole grains, and legumes). Research shows that fiber may lower blood cholesterol levels, especially the water-soluble fiber found in fruits, vegetables, oat products, and legumes. FRUITS AND VEGETABLES It is good to eat fruits and vegetables. Besides being sources of fiber, both are rich in vitamins and some minerals. They help you get the daily allowances of these nutrients. Fruits and vegetables can be used for snacks and desserts. MEATS Limit lean meat, chicken, [place], and fish to no more than 6 ounces per day. Beef, Pork, and Lamb Use lean cuts of beef, pork, and lamb. Lean cuts include:  Extra-lean ground beef.  Arm roast.  Sirloin tip.  Center-cut ham.  Round steak.  Loin chops.  Rump roast.  Tenderloin.  Trim all fat off the outside of meats before cooking. It is not necessary to severely decrease the intake of red meat, but lean choices should be made. Lean meat is rich in protein and contains a highly absorbable form of iron. Premenopausal women, in particular, should avoid reducing lean red meat because this could increase the risk for low red blood cells (iron-deficiency anemia).  Chicken and [place] These are good sources of protein. The fat of poultry can be reduced by removing the skin and underlying fat layers before cooking. Chicken and [place] can be substituted for lean red meat in the diet.  Poultry should not be fried or covered with high-fat sauces. Fish and Shellfish Fish is a good source of protein. Shellfish contain cholesterol, but they usually are low in saturated fatty acids. The preparation of fish is important. Like chicken and [place], they should not be fried or covered with high-fat sauces. EGGS Egg whites contain no fat or cholesterol. They can be eaten often. Try 1 to 2 egg whites instead of whole eggs in recipes or use egg substitutes that do not contain yolk.  MILK AND DAIRY PRODUCTS Use skim or 1% milk instead of 2% or whole milk. Decrease whole milk, natural, and processed cheeses. Use nonfat or low-fat (2%) cottage cheese or low-fat cheeses made from vegetable oils. Choose nonfat or low-fat (1 to 2%) yogurt. Experiment with evaporated skim milk in recipes that call for heavy cream. Substitute low-fat yogurt or low-fat cottage cheese for sour cream in dips and salad dressings. Have at least 2 servings of low-fat dairy products, such as 2 glasses of skim (or 1%) milk each day to help get your daily calcium intake.  FATS AND OILS Butterfat, lard, and beef fats are high in saturated fat and cholesterol. These should be avoided.Vegetable fats do not contain cholesterol. AVOID coconut oil, palm oil, and palm kernel oil, WHICH are very high in saturated fats. These should be limited. These fats are often used in bakery goods, processed foods, popcorn, oils, and nondairy creamers. Vegetable shortenings and some peanut butters contain hydrogenated oils, which are also saturated fats. Read the labels on these foods and check for  saturated vegetable oils.  Desirable liquid vegetable oils are corn oil, cottonseed oil, olive oil, canola oil, safflower oil, soybean oil, and sunflower oil. Peanut oil is not as good, but small amounts are acceptable. Buy a heart-healthy tub margarine that has no partially hydrogenated oils in the ingredients. AVOID Mayonnaise and salad dressings often are  made from unsaturated fats.  OTHER EATING TIPS Snacks  Most sweets should be limited as snacks. They tend to be rich in calories and fats, and their caloric content outweighs their nutritional value. Some good choices in snacks are graham crackers, melba toast, soda crackers, bagels (no egg), English muffins, fruits, and vegetables. These snacks are preferable to snack crackers, [place] fries, and chips. Popcorn should be air-popped or cooked in small amounts of liquid vegetable oil.  Desserts Eat fruit, low-fat yogurt, and fruit ices instead of pastries, cake, and cookies. Sherbet, angel food cake, gelatin dessert, frozen low-fat yogurt, or other frozen products that do not contain saturated fat (pure fruit juice bars, frozen ice pops) are also acceptable.   COOKING METHODS Choose those methods that use little or no fat. They include: Poaching.  Braising.  Steaming.  Grilling.  Baking.  Stir-frying.  Broiling.  Microwaving.  Foods can be cooked in a nonstick pan without added fat, or use a nonfat cooking spray in regular cookware. Limit fried foods and avoid frying in saturated fat. Add moisture to lean meats by using water, broth, cooking wines, and other nonfat or low-fat sauces along with the cooking methods mentioned above. Soups and stews should be chilled after cooking. The fat that forms on top after a few hours in the refrigerator should be skimmed off. When preparing meals, avoid using excess salt. Salt can contribute to raising blood pressure in some people.  EATING AWAY FROM HOME Order entres, potatoes, and vegetables without sauces or butter. When meat exceeds the size of a deck of cards (3 to 4 ounces), the rest can be taken home for another meal. Choose vegetable or fruit salads and ask for low-calorie salad dressings to be served on the side. Use dressings sparingly. Limit high-fat toppings, such as bacon, crumbled eggs, cheese, sunflower seeds, and olives. Ask for  heart-healthy tub margarine instead of butter.    Lifestyle and home remedies TO MANAGE REFLUX  You may eliminate or reduce the frequency of heartburn by making the following lifestyle changes:    Control your weight. Being overweight is a major risk factor for heartburn and GERD. Excess pounds put pressure on your abdomen, pushing up your stomach and causing acid to back up into your esophagus.    Eat smaller meals. 4 TO 6 MEALS A DAY. This reduces pressure on the lower esophageal sphincter, helping to prevent the valve from opening and acid from washing back into your esophagus.    Loosen your belt. Clothes that fit tightly around your waist put pressure on your abdomen and the lower esophageal sphincter.    Eliminate heartburn triggers. Everyone has specific triggers. Common triggers such as fatty or fried foods, spicy food, tomato sauce, carbonated beverages, alcohol, chocolate, mint, garlic, onion, caffeine and nicotine may make heartburn worse.    Avoid stooping or bending. Tying your shoes is OK. Bending over for longer periods to weed your garden isn't, especially soon after eating.    Don't lie down after a meal. Wait at least three to four hours after eating before going to bed, and don't lie down right after eating.    Alternative  medicine   Several home remedies exist for treating GERD, but they provide only temporary relief. They include drinking baking soda (sodium bicarbonate) added to water or drinking other fluids such as baking soda mixed with cream of tartar and water.   Although these liquids create temporary relief by neutralizing, washing away or buffering acids, eventually they aggravate the situation by adding gas and fluid to your stomach, increasing pressure and causing more acid reflux. Further, adding more sodium to your diet may increase your blood pressure and add stress to your heart, and excessive bicarbonate ingestion can alter the acid-base balance in your  body.    Irritable Bowel Syndrome Irritable Bowel Syndrome (IBS) is caused by a disturbance of normal bowel function. Other terms used are spastic colon, mucous colitis, and irritable colon. It does not require surgery, nor does it lead to cancer. There is no cure for IBS. But with proper diet, stress reduction, and medication, you will find that your problems (symptoms) will gradually disappear or improve. IBS is a common digestive disorder. It usually appears in late adolescence or early adulthood. Women develop it twice as often as men.  CAUSES  After food has been digested and absorbed in the small intestine, waste material is moved into the colon (large intestine). In the colon, water and salts are absorbed from the undigested products coming from the small intestine. The remaining residue, or fecal material, is held for elimination. Under normal circumstances, gentle, rhythmic contractions on the bowel walls push the fecal material along the colon towards the rectum. In IBS, however, these contractions are irregular and poorly coordinated. The fecal material is either retained too long, resulting in constipation, or expelled too soon, producing diarrhea.  SYMPTOMS  The most common symptom of IBS is pain. It is typically in the lower left side of the belly (abdomen). But it may occur anywhere in the abdomen. It can be felt as heartburn, backache, or even as a dull pain in the arms or shoulders. The pain comes from excessive bowel-muscle spasms and from the buildup of gas and fecal material in the colon. This pain:  Can range from sharp belly (abdominal) cramps to a dull, continuous ache.   Usually worsens soon after eating.   Is typically relieved by having a bowel movement or passing gas.  Abdominal pain is usually accompanied by constipation. But it may also produce diarrhea. The diarrhea typically occurs right after a meal or upon arising in the morning. The stools are typically soft and  watery. They are often flecked with secretions (mucus). Other symptoms of IBS include:  Bloating.   Loss of appetite.   Heartburn.   Feeling sick to your stomach (nausea).   Belching   Vomiting   Gas.  IBS may also cause a number of symptoms that are unrelated to the digestive system:  Fatigue.   Headaches.   Anxiety   Shortness of breath   Difficulty in concentrating.   Dizziness.  These symptoms tend to come and go.  DIAGNOSIS  The symptoms of IBS closely mimic the symptoms of other, more serious digestive disorders. So your caregiver may wish to perform a variety of additional tests to exclude these disorders. He/she wants to be certain of learning what is wrong (diagnosis). The nature and purpose of each test will be explained to you.  TREATMENT A number of medications are available to help correct bowel function and/or relieve bowel spasms and abdominal pain. Among the drugs available are:  Mild, non-irritating laxatives for severe constipation and to help restore normal bowel habits.   Specific anti-diarrheal medications to treat severe or prolonged diarrhea.   Anti-spasmodic agents to relieve intestinal cramps.   The important thing to remember is that if any drug is prescribed for you, make sure that you take it exactly as directed. Make sure that your caregiver knows how well it worked for you.  HOME CARE INSTRUCTIONS   Avoid foods that are high in fat or oils. Some examples ZOX:WRUEA cream, butter, frankfurters, sausage, and other fatty meats.   Avoid foods that have a laxative effect, such as fruit, fruit juice, and dairy products.   Cut out carbonated drinks, chewing gum, and "gassy" foods, such as beans and cabbage. This may help relieve bloating and belching.   Bran taken with plenty of liquids may help relieve constipation.   Keep track of what foods seem to trigger your symptoms.   Avoid emotionally charged situations or circumstances that  produce anxiety.   Start or continue exercising.   Get plenty of rest and sleep.

## 2012-01-11 NOTE — Assessment & Plan Note (Signed)
NOT ON APPROPRIATE THERAPY FOR GERD. LIKEY SX UNCONTROLLED AND CAUSING CHEST PAIN.  EXPLAINED TO PT SHE NEEDS A PPI. SHE WOULD LIKE TO TRY ALTERNATING RANITIDINE AND PRILOSEC. WILL CALL IF SHE WANTS TI CHANGE TO PREVACID, OR ACIPHEX. LOSE WEIGHT. FOLLOW A LOW FAT DIET. PT STATES SHE DOESN'T HAVE GERD WITH CHEESEBURGERS. OPV IN 3 MOS.

## 2012-01-11 NOTE — Progress Notes (Signed)
Subjective:    Patient ID: Anna Gibson, female    DOB: 1946/01/27, 66 y.o.   MRN: [id number]  PCP: [id number]  HPI PT INITIALLY SEEN IN 2008 FOR SIMILAR SYMPTOMS, WEIGHT 165 LBS. ON MULTIPLE PPI AND REPORTS SIDE EFFECTS.   1. DIARRHEA-She  states she has over a 35-year history of chronic intermittent diarrhea.  Stools have altered between diarrhea and constipation for years. She  has a long history of postprandial loose stools. She also has a history  of having diarrhea with stress. She states over the past several  months, however, her symptoms have been progressively worse. She is  getting to the point she is afraid to go out. She is having 5 and 6  stools a day. Denies any nocturnal diarrhea. Denies any melena or  rectal bleeding. Her husband notes that if she is stressed out she  tends to have an upset stomach and has diarrhea. He also states after  eating lots of tomatoes over the summer her symptoms seem to be worse.  There are certain restaurants that she cannot eat at without having to  run and have diarrhea. She recently tried Bentyl which worked for a  couple of days but then she says her symptoms got worse so she stopped  it.   2. GERD-She also has chronic GERD. She previously did very well on  Prilosec but then lately she has noticed it was causing her stomach to  hurt and she stopped the medication. Previously, she had diarrhea with  Nexium. She took Zegerid for a few days last week and really did not  notice any improvement of her GERD and states it caused her to have  abdominal pain. In the past she took Prevacid without any problems.  She also did well on Aciphex in the past.   3. DYSPHAGIA-She complains of dysphagia and odynophagia at times. Not particularly related to any certain food.  Denies any nausea or vomiting or unintentional weight loss. She has  never had an EGD or colonoscopy. She collected stools for studies and  turned them in yesterday but results are  pending. Back in August she  had labs with a white count of 3500, otherwise CBC was normal, LFTs, MET  7 were normal, TSH was 3.206, normal.   LAST SEEN 2009: 166 LBS, Dx: GERD/IBS, RX: PRILOSEC, PROBIOTICS. STOPPED HER PRILOSEC FOR MOS. SINCEJAN 2013-HAVING EXTREME PAIN IN HER CHEST. HEART PALPITATIONS. ALSO ON 14 DAYS ABX AND HAD STOMACH UPSET AFTER 7 DAYS.KEEPS HAVING A RECURRING EAR INFECTION.  HAD FAMILY TRAUMA IN NOV & PT HAS BEEN UPSET. HERNIA PRETTY GOOD SINCE SHE SAW ME. TAKING OMEPRAZOLE BUT THOUGHT IT MADE HER HAIR FALL OUT. OFF FOR MONTHS AND BEEN BACK ON IT FOR 1 WEEK. BMs: OFF AND ON CONSTIPATION, NOTHING NEW, NO WORSE. MAY HAVE TROUBLE SWALLOWING IF SHE EATS A TUNA FISH SANDWICH & IT WON'T GO DOWN. FEELS LIKE A ROCK AND TRIES TO SWALLOW AND FEELS LIKE A ROCK ALL THE WAY DOWN. WEIGHT DOWN 2 LBS SINCE 2008.  Past Medical History  Diagnosis Date  . Hypertension   . HTN (hypertension), though recently low BP 09/29/2011   Past Surgical History  Procedure Date  . Cesarean section   . Cesarean section   . Colonoscopy 08/14/2007    Rare sigmoid colon diverticula/ A 2 cm hiatal hernia.  Multiple sessile gastric polyp / Screening colonoscopy in 10 years   . Esophagogastroduodenoscopy 08/14/2007     Normal duodenum/Gastritis is the  likely cause for her abdominal pain    Allergies  Allergen Reactions  . Indocin Other (See Comments)    "sever headache"    Current Outpatient Prescriptions  Medication Sig Dispense Refill  . ALPRAZolam (XANAX) 0.5 MG tablet Take 0.5 mg by mouth as needed.       . cetirizine (ZYRTEC) 10 MG tablet Take 10 mg by mouth daily.      . magnesium oxide (MAG-OX) 400 MG tablet Take 400 mg by mouth daily.            Marland Kitchen PRILOSEC  20 MG DAILY      . metoprolol tartrate (LOPRESSOR) 25 MG tablet Take 25 mg by mouth daily.      .      . ranitidine (ZANTAC) 150 MG tablet Take 150 mg by mouth 2 (two) times daily.          Review of Systems     Objective:    Physical Exam  Vitals reviewed. Constitutional: She is oriented to person, place, and time. She appears well-developed and well-nourished. No distress.  HENT:  Head: Normocephalic and atraumatic.  Mouth/Throat: Oropharynx is clear and moist. No oropharyngeal exudate.  Eyes: Pupils are equal, round, and reactive to light. No scleral icterus.  Neck: Normal range of motion. Neck supple.  Cardiovascular: Normal rate, regular rhythm and normal heart sounds.   Pulmonary/Chest: Effort normal and breath sounds normal. No respiratory distress.  Abdominal: Soft. Bowel sounds are normal. She exhibits no distension. There is no tenderness.  Musculoskeletal: She exhibits no edema.  Lymphadenopathy:    She has no cervical adenopathy.  Neurological: She is alert and oriented to person, place, and time.       NO FOCAL DEFICITS   Psychiatric:       ANXIOUS MOOD          Assessment & Plan:

## 2012-01-12 NOTE — Progress Notes (Signed)
Reminder in epic to follow up in 3 months °

## 2012-02-24 NOTE — Telephone Encounter (Signed)
Open in error

## 2012-06-25 ENCOUNTER — Encounter (HOSPITAL_COMMUNITY): Payer: Self-pay | Admitting: *Deleted

## 2012-06-25 ENCOUNTER — Emergency Department (HOSPITAL_COMMUNITY): Payer: Managed Care, Other (non HMO)

## 2012-06-25 ENCOUNTER — Emergency Department (HOSPITAL_COMMUNITY)
Admission: EM | Admit: 2012-06-25 | Discharge: 2012-06-25 | Disposition: A | Discharge status: Home / Self Care | Payer: Managed Care, Other (non HMO) | Attending: Emergency Medicine | Admitting: Emergency Medicine

## 2012-06-25 DIAGNOSIS — W010XXA Fall on same level from slipping, tripping and stumbling without subsequent striking against object, initial encounter: Secondary | ICD-10-CM | POA: Insufficient documentation

## 2012-06-25 DIAGNOSIS — Z79899 Other long term (current) drug therapy: Secondary | ICD-10-CM | POA: Insufficient documentation

## 2012-06-25 DIAGNOSIS — S63509A Unspecified sprain of unspecified wrist, initial encounter: Secondary | ICD-10-CM | POA: Insufficient documentation

## 2012-06-25 DIAGNOSIS — I10 Essential (primary) hypertension: Secondary | ICD-10-CM | POA: Insufficient documentation

## 2012-06-25 DIAGNOSIS — Z9089 Acquired absence of other organs: Secondary | ICD-10-CM | POA: Insufficient documentation

## 2012-06-25 MED ORDER — OXYCODONE-ACETAMINOPHEN 5-325 MG PO TABS: 1.0000 | ORAL_TABLET | ORAL | Status: AC | PRN

## 2012-06-25 MED ORDER — MORPHINE SULFATE 4 MG/ML IJ SOLN
6.0000 mg | Freq: Once | INTRAMUSCULAR | Status: AC
  Administered 2012-06-25: 6 mg via INTRAMUSCULAR
  Filled 2012-06-25: qty 2

## 2012-06-25 MED ORDER — MORPHINE SULFATE 4 MG/ML IJ SOLN: 6.0000 mg | Freq: Once | INTRAMUSCULAR | Status: DC

## 2012-06-25 MED ORDER — MORPHINE SULFATE 2 MG/ML IJ SOLN
2.0000 mg | Freq: Once | INTRAMUSCULAR | Status: AC
  Administered 2012-06-25: 2 mg via INTRAMUSCULAR
  Filled 2012-06-25: qty 1

## 2012-06-25 MED ORDER — ONDANSETRON 8 MG PO TBDP
8.0000 mg | ORAL_TABLET | Freq: Once | ORAL | Status: AC
Start: 2012-06-25 — End: 2012-06-25
  Administered 2012-06-25: 8 mg via ORAL
  Filled 2012-06-25: qty 1

## 2012-06-25 MED ORDER — MORPHINE SULFATE 2 MG/ML IJ SOLN: 6.0000 mg | Freq: Once | INTRAMUSCULAR | Status: DC

## 2012-06-25 NOTE — ED Notes (Addendum)
Larey Seat this am, tripped over hose.  Pain lt wrist.  Pt had wrist and hand wrapped with coban.  Coban removed , good radial pulse. Ice pack .

## 2012-06-25 NOTE — ED Provider Notes (Signed)
Medical screening examination/treatment/procedure(s) were performed by non-physician practitioner and as supervising physician I was immediately available for consultation/collaboration.   Wladyslawa Disbro B. Shaniyah Wix, MD 06/25/12 2134 

## 2012-06-25 NOTE — ED Provider Notes (Signed)
History     CSN: [id number]  Arrival date & time 06/25/12  1547   First MD Initiated Contact with Patient 06/25/12 1607      Chief Complaint  Patient presents with  . Arm Pain    (Consider location/radiation/quality/duration/timing/severity/associated sxs/prior treatment) HPI Comments: Patient complains of left wrist pain that began at 11:30 today. States that she slipped and fell on outstretched hand. She developed sharp pain into her wrist at that time pain has gradually increased.  She is unable to flex her wrist due to the level of pain. She states the pain radiates into her lower forearm.  She denies numbness or tingling to her fingers. She also denies elbow or shoulder pain. She also states that she fell back and struck her head in the grass, but denies LOC, neck pain, headache, or dizziness.  Patient is left-hand dominant.  Patient is a 66 y.o. female presenting with wrist pain. The history is provided by the patient.  Wrist Pain This is a new problem. The current episode started today. The problem occurs constantly. The problem has been gradually worsening. Associated symptoms include arthralgias and joint swelling. Pertinent negatives include no chest pain, congestion, diaphoresis, fever, headaches, neck pain, numbness, rash, sore throat, vertigo, visual change, vomiting or weakness. The symptoms are aggravated by bending (Movement and palpation). She has tried ice and acetaminophen for the symptoms. The treatment provided no relief.    Past Medical History  Diagnosis Date  . Hypertension   . HTN (hypertension), though recently low BP 09/29/2011    Past Surgical History  Procedure Date  . Cesarean section   . Cesarean section   . Colonoscopy 08/14/2007    Rare sigmoid colon diverticula/ A 2 cm hiatal hernia.  Multiple sessile gastric polyp / Screening colonoscopy in 10 years   . Esophagogastroduodenoscopy 08/14/2007     Normal duodenum/Gastritis is the likely cause for her  abdominal pain  . Cholecystectomy     Family History  Problem Relation Age of Onset  . Congenital heart disease Mother   . Coronary artery disease Brother     History  Substance Use Topics  . Smoking status: Never Smoker   . Smokeless tobacco: Never Used  . Alcohol Use: No    OB History    Grav Para Term Preterm Abortions TAB SAB Ect Mult Living                  Review of Systems  Constitutional: Negative for fever and diaphoresis.  HENT: Negative for congestion, sore throat and neck pain.   Cardiovascular: Negative for chest pain.  Gastrointestinal: Negative for vomiting.  Musculoskeletal: Positive for joint swelling and arthralgias. Negative for back pain and gait problem.  Skin: Negative for rash and wound.  Neurological: Negative for dizziness, vertigo, syncope, speech difficulty, weakness, numbness and headaches.  All other systems reviewed and are negative.    Allergies  Indomethacin  Home Medications   Current Outpatient Rx  Name Route Sig Dispense Refill  . CETIRIZINE HCL 10 MG PO TABS Oral Take 10 mg by mouth daily.    Marland Kitchen MAGNESIUM OXIDE 400 MG PO TABS Oral Take 400 mg by mouth daily.    Marland Kitchen METOPROLOL TARTRATE 25 MG PO TABS Oral Take 25 mg by mouth daily.    Marland Kitchen OMEPRAZOLE 20 MG PO CPDR  1 PO 30 MINUTES PRIOR TO YOUR FIRST MEAL 93 capsule 3  . RANITIDINE HCL 150 MG PO TABS Oral Take 1 tablet (150 mg  total) by mouth 2 (two) times daily. AS NEEDED. YOU SHOULD ALTERNATE WITH PRILOSEC. 30 tablet 3    BP 171/96  Pulse 82  Temp 98.1 F (36.7 C) (Oral)  Resp 20  Ht 5\' 4"  (1.626 m)  Wt 160 lb (72.576 kg)  BMI 27.46 kg/m2  SpO2 100%  Physical Exam  Nursing note and vitals reviewed. Constitutional: She is oriented to person, place, and time. She appears well-developed and well-nourished. No distress.  HENT:  Head: Normocephalic and atraumatic.  Cardiovascular: Normal rate, regular rhythm and normal heart sounds.   Pulmonary/Chest: Effort normal and breath  sounds normal.  Musculoskeletal: She exhibits tenderness. She exhibits no edema.       Left wrist: She exhibits decreased range of motion, tenderness, bony tenderness and swelling. She exhibits no effusion, no crepitus, no deformity and no laceration.       Arms:      ttp with STS to the dorsal wrist.  Radial pulse is brisk, sensation intact.  CR< 2 sec.  No bruising or erythema  Neurological: She is alert and oriented to person, place, and time. She exhibits normal muscle tone. Coordination normal.  Skin: Skin is warm and dry.    ED Course  Procedures (including critical care time)  Labs Reviewed - No data to display Dg Wrist Complete Left  06/25/2012  *RADIOLOGY REPORT*  Clinical Data: Left wrist pain, fall  LEFT WRIST - COMPLETE 3+ VIEW  Comparison: None.  Findings: Moderate to severe degenerative changes are noted at the first carpal metacarpal joint.  No fracture or dislocation identified.  No radiopaque foreign body.  IMPRESSION: Moderate to severe degenerative change at the first carpometacarpal joint.  No acute finding.   Original Report Authenticated By: Harrel Lemon, M.D.      velcro wrist splint applied, remains NV intact.  Pain improved.     MDM   Diffuse tenderness and STS of the left wrist.  Pt requests referral to [facility] orthopedics.  Advised pt to elevate, apply ice , wear splint for comfort and close f/u if the sx's are not improving.    The patient appears reasonably screened and/or stabilized for discharge and I doubt any other medical condition or other [facility] requiring further screening, evaluation, or treatment in the ED at this time prior to discharge.   Prescribed: Percocet #[id number]         Milagros Middendorf L. Shailah Gibbins, [place] 06/25/12 1900

## 2012-08-14 ENCOUNTER — Other Ambulatory Visit (HOSPITAL_COMMUNITY): Payer: Self-pay | Admitting: Family Medicine

## 2012-08-14 DIAGNOSIS — IMO0002 Reserved for concepts with insufficient information to code with codable children: Secondary | ICD-10-CM

## 2012-08-15 ENCOUNTER — Ambulatory Visit (HOSPITAL_COMMUNITY): Payer: Medicare Other

## 2012-08-15 ENCOUNTER — Ambulatory Visit (HOSPITAL_COMMUNITY)
Admission: RE | Admit: 2012-08-15 | Discharge: 2012-08-15 | Disposition: A | Discharge status: Home / Self Care | Payer: Medicare Other | Source: Ambulatory Visit | Attending: Family Medicine | Admitting: Family Medicine

## 2012-08-15 DIAGNOSIS — IMO0002 Reserved for concepts with insufficient information to code with codable children: Secondary | ICD-10-CM

## 2012-08-15 DIAGNOSIS — M47812 Spondylosis without myelopathy or radiculopathy, cervical region: Secondary | ICD-10-CM | POA: Insufficient documentation

## 2012-08-20 ENCOUNTER — Ambulatory Visit (HOSPITAL_COMMUNITY): Payer: Medicare Other

## 2012-09-14 ENCOUNTER — Other Ambulatory Visit (HOSPITAL_COMMUNITY): Payer: Self-pay | Admitting: Physician Assistant

## 2012-09-14 DIAGNOSIS — Z1329 Encounter for screening for other suspected endocrine disorder: Secondary | ICD-10-CM

## 2012-09-20 ENCOUNTER — Ambulatory Visit (HOSPITAL_COMMUNITY)
Admission: RE | Admit: 2012-09-20 | Discharge: 2012-09-20 | Disposition: A | Discharge status: Home / Self Care | Payer: Medicare Other | Source: Ambulatory Visit | Attending: Physician Assistant | Admitting: Physician Assistant

## 2012-09-20 DIAGNOSIS — E042 Nontoxic multinodular goiter: Secondary | ICD-10-CM | POA: Insufficient documentation

## 2012-09-20 DIAGNOSIS — Z1329 Encounter for screening for other suspected endocrine disorder: Secondary | ICD-10-CM

## 2014-07-14 ENCOUNTER — Telehealth: Payer: Self-pay | Admitting: Gastroenterology

## 2014-07-14 NOTE — Telephone Encounter (Signed)
Tried to call pt. Many rings and no answer. Last OV here with Dr. Oneida Alar on 01/11/2012. She should contact her PCP now. She has appt here on 08/13/2014 with Neil Crouch, PA.

## 2014-07-14 NOTE — Telephone Encounter (Signed)
PATIENT CALLED TO MAKE AN APPOINTMENT AND STATED THAT SHE HAS ACID REFLUX INTO HER MOUTH. SHE WANTS SOMETHING TO HELP TO LAST HER UNTIL HER APPOINTMENT   PLEASE ADVISE

## 2014-07-14 NOTE — Telephone Encounter (Signed)
PT called back about her reflux and I told her that since she has not been seen here in over 2 years we cannot make recommendations. She should see her PCP for now and keep appt in Nov. She formerly saw DR. McGough, but since he has retired she is seeing Dr. Ethlyn Gallery and she will contact her office.

## 2014-07-14 NOTE — Telephone Encounter (Signed)
agree

## 2014-08-13 ENCOUNTER — Ambulatory Visit (INDEPENDENT_AMBULATORY_CARE_PROVIDER_SITE_OTHER): Payer: Medicare Other | Admitting: Gastroenterology

## 2014-08-13 ENCOUNTER — Encounter: Payer: Self-pay | Admitting: Gastroenterology

## 2014-08-13 VITALS — BP 138/91 | HR 77 | Temp 97.8°F | Ht 63.0 in | Wt 155.0 lb

## 2014-08-13 DIAGNOSIS — K146 Glossodynia: Secondary | ICD-10-CM | POA: Insufficient documentation

## 2014-08-13 DIAGNOSIS — K219 Gastro-esophageal reflux disease without esophagitis: Secondary | ICD-10-CM

## 2014-08-13 DIAGNOSIS — R208 Other disturbances of skin sensation: Secondary | ICD-10-CM

## 2014-08-13 HISTORY — DX: Other disturbances of skin sensation: R20.8

## 2014-08-13 MED ORDER — DEXLANSOPRAZOLE 60 MG PO CPDR: 60.0000 mg | DELAYED_RELEASE_CAPSULE | Freq: Every day | ORAL | Status: DC

## 2014-08-13 NOTE — Patient Instructions (Signed)
1. Start Dexilant one daily before breakfast. Call in two weeks and let me know how you are doing. 2. I will obtain labs from Dr. Ethlyn Gallery for review and order an additional that may be needed.

## 2014-08-13 NOTE — Progress Notes (Signed)
Received records from PCP: Sjorgren's SSA/SSB negative, magnesium 2.0, ferritin 59, folate >20, b12 413, ANA neg  Need to still get labs: B6, iron, zinc levels.

## 2014-08-13 NOTE — Assessment & Plan Note (Signed)
4 month history of burning sensation of the gums, tongue, roof of mouth which began after a significant dental work. Episodic flare of typical heartburn. Long discussion with patient and spouse today. She has had a significant workup including allergy testing and autoimmune workup per patient. I have requested labs for review. We need to rule out B6, B12, iron, folate, zinc deficiency. Will restart PPI to treat for GERD although I am doubtful that this will help her mouth symptoms. She is opposed to taking omeprazole daily due to previously noting hair loss which she relates to the medication. Trial of Dexilant 60mg  1 daily before breakfast. Samples provided. She may continue Zantac 150 mg at bedtime until the medication takes effect. If she has side effects to the Dexilant we may be left with Zantac twice a day. Patient knows to call me in 2 weeks with a progress report. 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

## 2014-08-13 NOTE — Addendum Note (Signed)
Addended by: Mahala Menghini on: 08/13/2014 03:40 PM   Modules accepted: Orders

## 2014-08-13 NOTE — Progress Notes (Signed)
Primary Care Physician:  Micheline Rough, MD  Primary Gastroenterologist:  Barney Drain, MD   Chief Complaint  Patient presents with  . Gastrophageal Reflux  . Burning in Mouth    On Gums & Tongue    HPI:  Anna Gibson is a 68 y.o. female here for further evaluation of burning of the mouth. She was last seen in April 2013 by Dr. Oneida Alar for IBS and GERD. With regards to chronic GERD, previously she did well on Prilosec but noted that her hair was falling out so she switched to Zantac. Nexium caused diarrhea. Zegerid was ineffective. Had done well on AcipHex in the past. Took Prevacid before without any problems. She also has chronic dysphagia and odynophagia intermittently. EGD and colonoscopy in November 2008 showed rare sigmoid colon diverticula. Random colon biopsies benign. Next colonoscopy in November 2018. She had a 2 cm hiatal hernia, multiple sessile gastric polyps, multiple erosions in the antrum, normal duodenum. Small bowel biopsy negative for celiac, stomach biopsy showed reactive gastropathy but no H pylori., Gastric polyp benign.  Notes four-month history of extreme burning and pain involving the mouth. She had extensive dental work including multiple root canals, crowns, replaced feelings, fluoride treatments. Burning mouth began around this time. Initially she had burning of the gums and this led to burning of the tongue and roof of the mouth. Symptoms occur all day long but definitely worse as the day goes by. She has not had any particular ulcers or sores in the mouth. No evidence of yeast. She has seen her PCP, allergist, dentist, endodontist. Work-up has been "negative" per patient.   She states when the burning started she stopped all of her medications but nothing helped. She has been primarily on Zantac 150 mg at bedtime for her reflux. She had a period of time or her reflux was poorly controlled and her PCP put her on pantoprazole. Within a few days her GERD was controlled  however after being on the medication for 2 weeks she started having abdominal pain and headaches which Her up at night. This resolved after stopping the medication. She takes Prilosec only as needed due to her concern of previous hair loss related to the medication. Biotene helps a little but only temporarily. She wonders if her burning mouth is related to the fluoride treatments. She had fluoride painted on at the dentist, had been using toothpaste with prescription strength fluoride in it as well as mouthwash. She now tries to avoid all sources of fluoride including in her water. Really no significant improvement although initially she felt there was some.   Current Outpatient Prescriptions  Medication Sig Dispense Refill  . fluticasone (FLONASE) 50 MCG/ACT nasal spray Place 2 sprays into the nose daily.    . magnesium oxide (MAG-OX) 400 MG tablet Take 400 mg by mouth daily.    . Omeprazole (PRILOSEC PO) Take by mouth as needed.    . Probiotic Product (PROBIOTIC DAILY PO) Take 1 tablet by mouth daily.    . psyllium (METAMUCIL) 58.6 % powder Take 1 packet by mouth daily. SUGAR FREE-ORANGE FLAVORED    . ranitidine (ZANTAC) 150 MG tablet Take 1 tablet (150 mg total) by mouth 2 (two) times daily. AS NEEDED. YOU SHOULD ALTERNATE WITH PRILOSEC. 30 tablet 3  . cetirizine (ZYRTEC) 10 MG tablet Take 10 mg by mouth daily.    . metoprolol tartrate (LOPRESSOR) 25 MG tablet Take 25 mg by mouth every 30 (thirty) days.     . predniSONE (  STERAPRED UNI-PAK) 5 MG TABS Take by mouth as directed. For 12 days     No current facility-administered medications for this visit.    Allergies as of 08/13/2014 - Review Complete 08/13/2014  Allergen Reaction Noted  . Indomethacin Other (See Comments) 09/29/2011    Past Medical History  Diagnosis Date  . Hypertension   . HTN (hypertension), though recently low BP 09/29/2011    Past Surgical History  Procedure Laterality Date  . Cesarean section    . Cesarean  section    . Colonoscopy  08/14/2007    Rare sigmoid colon diverticula/ A 2 cm hiatal hernia.  Multiple sessile gastric polyp / Screening colonoscopy in 10 years   . Esophagogastroduodenoscopy  08/14/2007     Normal duodenum/Gastritis is the likely cause for her abdominal pain  . Cholecystectomy      Family History  Problem Relation Age of Onset  . Congenital heart disease Mother   . Coronary artery disease Brother     History   Social History  . Marital Status: Married    Spouse Name: N/A    Number of Children: N/A  . Years of Education: N/A   Occupational History  . Not on file.   Social History Main Topics  . Smoking status: Never Smoker   . Smokeless tobacco: Never Used  . Alcohol Use: No  . Drug Use: No  . Sexual Activity: Yes   Other Topics Concern  . Not on file   Social History Narrative      ROS:  General: Negative for anorexia, weight loss, fever, chills, fatigue, weakness. Eyes: Negative for vision changes.  ENT: See history of present illness CV: Negative for chest pain, angina, palpitations, dyspnea on exertion, peripheral edema.  Respiratory: Negative for dyspnea at rest, dyspnea on exertion, cough, sputum, wheezing.  GI: See history of present illness. GU:  Negative for dysuria, hematuria, urinary incontinence, urinary frequency, nocturnal urination.  MS: Negative for joint pain, low back pain.  Derm: Negative for rash or itching.  Neuro: Negative for weakness, abnormal sensation, seizure, frequent headaches, memory loss, confusion.  Psych: Negative for anxiety, depression, suicidal ideation, hallucinations.  Endo: Negative for unusual weight change.  Heme: Negative for bruising or bleeding. Allergy: Negative for rash or hives.    Physical Examination:  BP 138/91 mmHg  Pulse 77  Temp(Src) 97.8 F (36.6 C) (Oral)  Ht 5\' 3"  (1.6 m)  Wt 155 lb (70.308 kg)  BMI 27.46 kg/m2   General: Well-nourished, well-developed in no acute distress.   Head: Normocephalic, atraumatic.   Eyes: Conjunctiva pink, no icterus. Mouth: Oropharyngeal mucosa moist and pink , no lesions erythema or exudate. Neck: Supple without thyromegaly, masses, or lymphadenopathy.  Lungs: Clear to auscultation bilaterally.  Heart: Regular rate and rhythm, no murmurs rubs or gallops.  Abdomen: Bowel sounds are normal, nontender, nondistended, no hepatosplenomegaly or masses, no abdominal bruits or    hernia , no rebound or guarding.   Rectal: Not performed Extremities: No lower extremity edema. No clubbing or deformities.  Neuro: Alert and oriented x 4 , grossly normal neurologically.  Skin: Warm and dry, no rash or jaundice.   Psych: Alert and cooperative, normal mood and affect.  Labs: Requested recent labs  Imaging Studies: No results found.

## 2014-08-19 NOTE — Progress Notes (Signed)
cc'ed to pcp °

## 2014-08-25 ENCOUNTER — Telehealth: Payer: Self-pay

## 2014-08-25 NOTE — Telephone Encounter (Signed)
See OV notes, pt still needs labs after Neil Crouch, PA reviewed labs from PCP.  Pt is aware and orders have been faxed to [facility].

## 2014-08-25 NOTE — Progress Notes (Signed)
PT is aware and lab orders were faxed to [facility].

## 2014-08-27 LAB — IRON: IRON: 60 ug/dL (ref 42–145)

## 2014-08-28 ENCOUNTER — Encounter (HOSPITAL_COMMUNITY): Payer: Self-pay | Admitting: Cardiovascular Disease

## 2014-08-28 LAB — ZINC: Zinc: 71 ug/dL (ref 60–130)

## 2014-08-29 LAB — VITAMIN B6: Vitamin B6: 16.7 ng/mL (ref 2.1–21.7)

## 2014-09-09 ENCOUNTER — Telehealth: Payer: Self-pay

## 2014-09-09 NOTE — Telephone Encounter (Signed)
Noted. If she would like to try a tricyclic antidepressant for the burning pain until her OV we can start on low-dose Elavil. It may have at the nerve receptor level to diminish the burning pain.  If she is interested, let me know and I will send an RX. Also please let her know her labs regarding vitamin levels were normal.

## 2014-09-09 NOTE — Telephone Encounter (Signed)
Called pt and no answer. Will try again on 09/10/2014

## 2014-09-09 NOTE — Telephone Encounter (Signed)
Pt called and said Anna Gibson told her to call back and let her know if the  Dexilant was working.  States it gave her diarrhea. PCP put her on protonix for a while and says it is helping.  Pt is still having burning in her mouth. Requests an appt to follow up. Pt has appt on 10/20/2014 1130am.

## 2014-09-10 MED ORDER — AMITRIPTYLINE HCL 10 MG PO TABS: ORAL_TABLET | ORAL | Status: DC

## 2014-09-10 NOTE — Progress Notes (Signed)
Quick Note:  Patient aware. ______ 

## 2014-09-10 NOTE — Telephone Encounter (Signed)
Called x 2 this am. The first time line was busy and the 2nd time there was no answer.  Will try again later in the morning.

## 2014-09-10 NOTE — Telephone Encounter (Signed)
RX done. 

## 2014-09-10 NOTE — Telephone Encounter (Signed)
Pt called back and said that the Elavil would be great just to get her through to her appt. She uses [facility] in [place].

## 2014-09-10 NOTE — Telephone Encounter (Signed)
Pt aware.

## 2014-09-13 IMAGING — US US SOFT TISSUE HEAD/NECK
1 series · 14 of 25 positions shown · non-contrast
Comparison: None.

CLINICAL DATA: Screening

THYROID ULTRASOUND
TECHNIQUE: Ultrasound examination of the thyroid gland and adjacent
soft tissues was performed.

[Series 1: us soft tissue head/neck · 0.05mm/px · 14 of 63 slices shown]
[im 1/63]
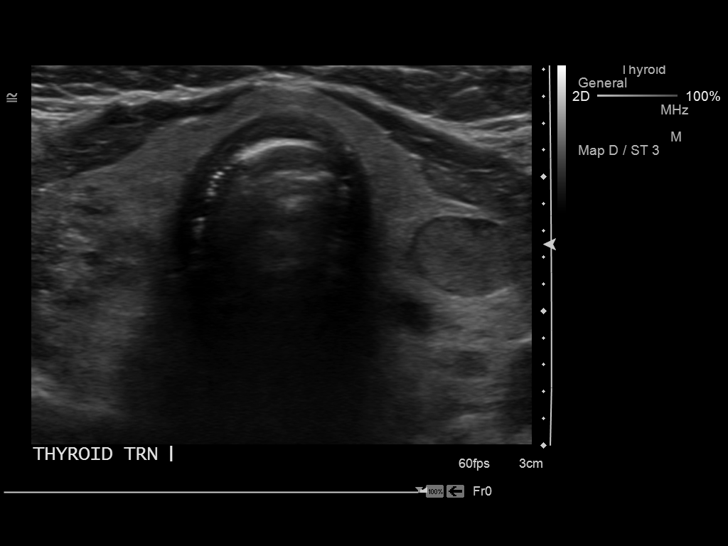
[im 6/63]
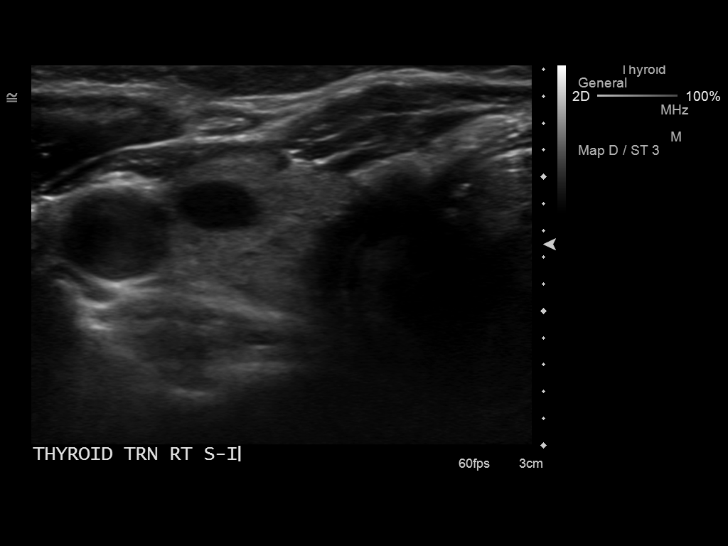
[im 11/63]
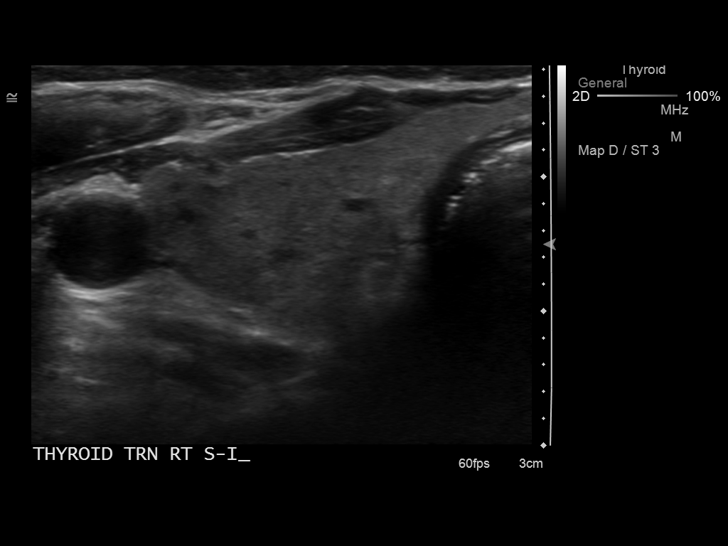
[im 16/63]
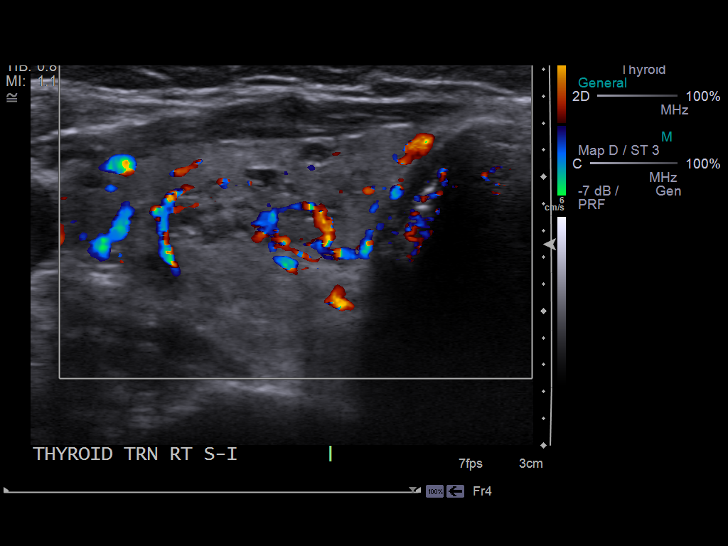
[im 21/63]
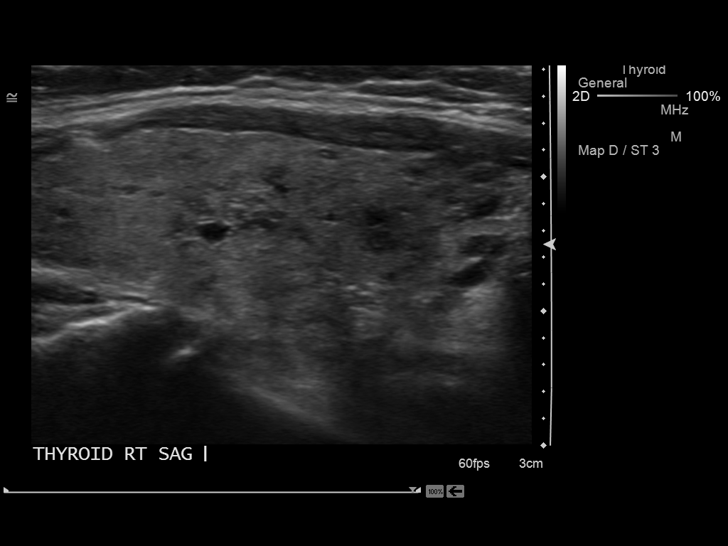
[im 24/63]
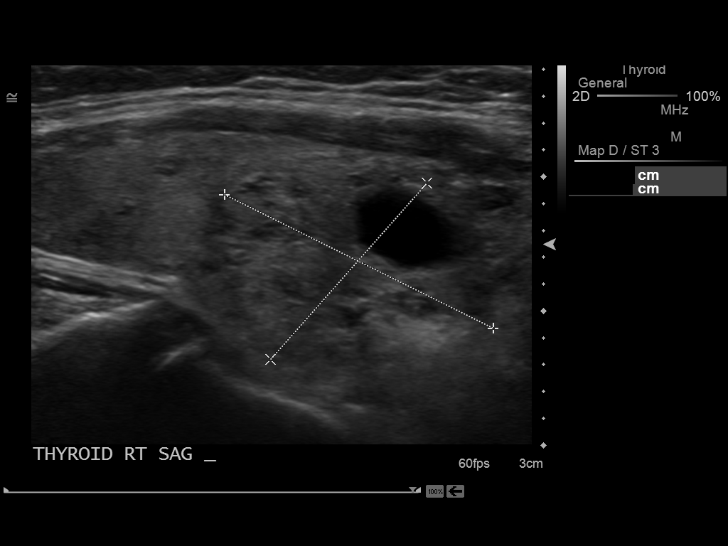
[im 29/63]
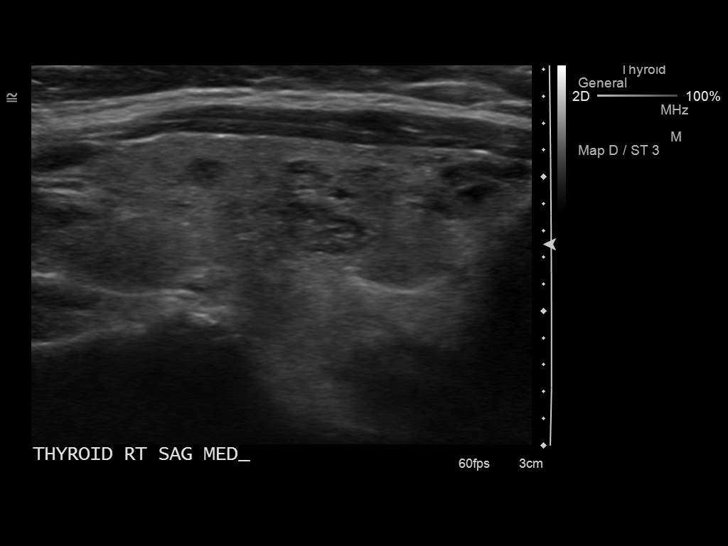
[im 34/63]
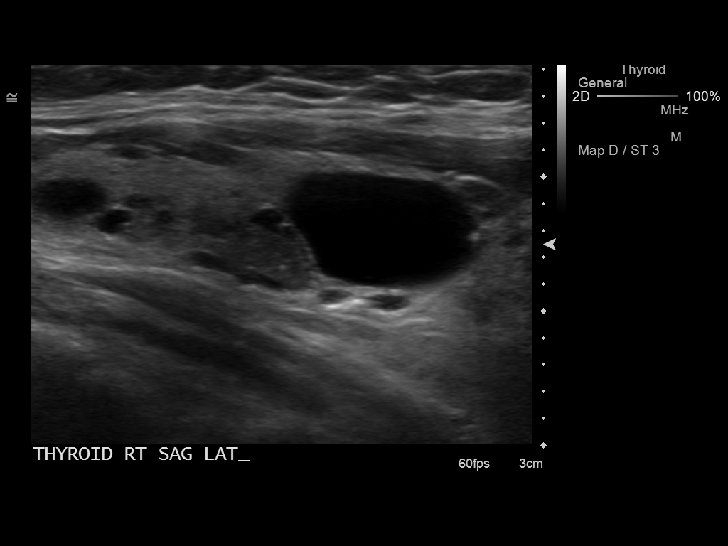
[im 39/63]
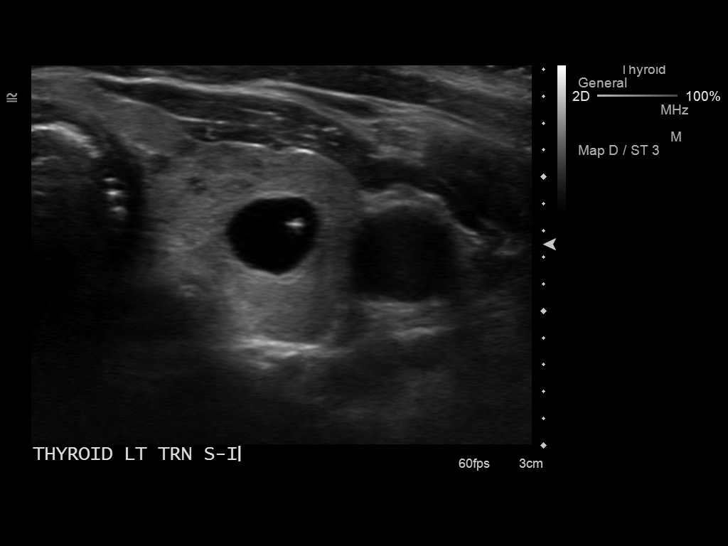
[im 42/63]
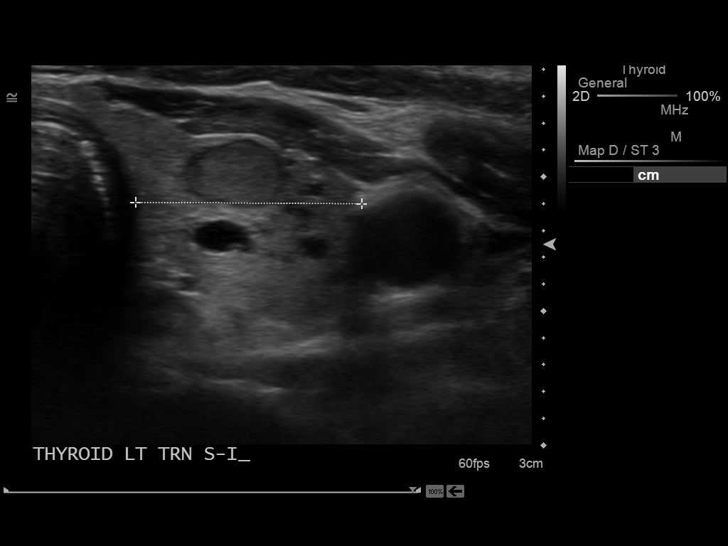
[im 47/63]
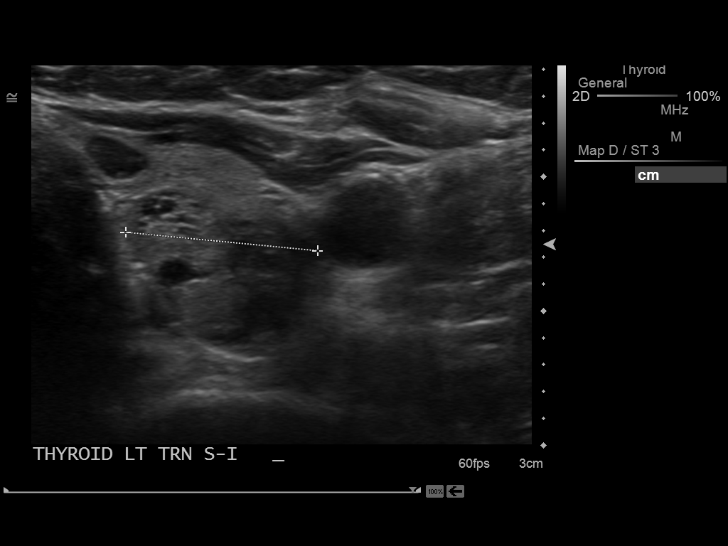
[im 52/63]
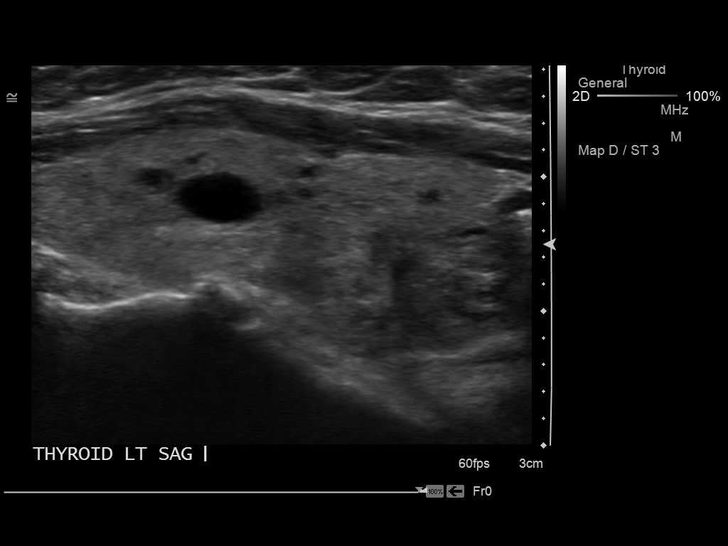
[im 57/63]
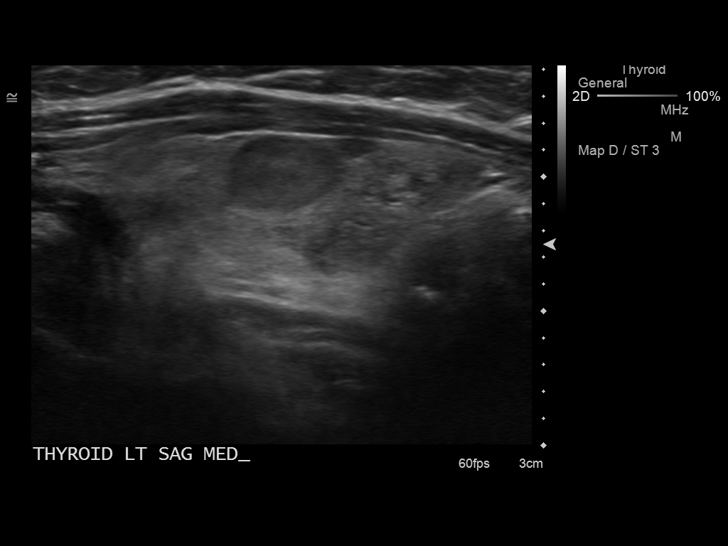
[im 63/63]
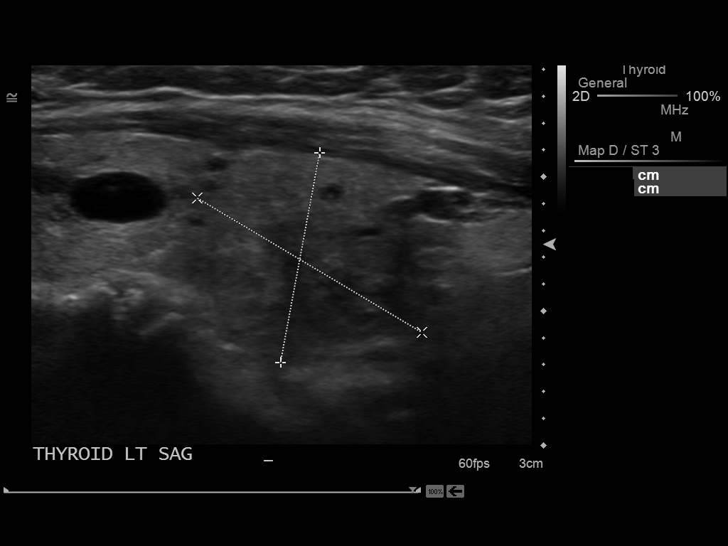

[14 of 25 positions shown; findings below may reference images not displayed]

FINDINGS: Right thyroid lobe:  17 x 22 x 39 mm, inhomogeneous echotexture
Left thyroid lobe:  16 x 17 of 41 mm
Isthmus:  1 x 5 mm in thickness

Focal nodules:  13 x 18 x 23 mm complex mostly solid, mid-right
14 x 16 x 20 mm  solid, inferior left
6 x 9 x 10 mm cyst, superior left

Lymphadenopathy:  None visualized.
IMPRESSION: 1.  Bilateral thyroid nodules.  Both   meet consensus criteria for
biopsy.  Ultrasound-guided fine needle aspiration should be
considered, as per the consensus statement: Management of Thyroid
Nodules Detected at US:  Society of [employer]iologists in Ultrasound

## 2014-10-20 ENCOUNTER — Encounter: Payer: Self-pay | Admitting: Gastroenterology

## 2014-10-20 ENCOUNTER — Ambulatory Visit (INDEPENDENT_AMBULATORY_CARE_PROVIDER_SITE_OTHER): Payer: Medicare Other | Admitting: Gastroenterology

## 2014-10-20 VITALS — BP 162/102 | HR 84 | Temp 97.9°F | Ht 62.0 in | Wt 155.8 lb

## 2014-10-20 DIAGNOSIS — K219 Gastro-esophageal reflux disease without esophagitis: Secondary | ICD-10-CM

## 2014-10-20 DIAGNOSIS — R208 Other disturbances of skin sensation: Secondary | ICD-10-CM

## 2014-10-20 MED ORDER — AMITRIPTYLINE HCL 10 MG PO TABS: 10.0000 mg | ORAL_TABLET | Freq: Every day | ORAL | Status: DC

## 2014-10-20 MED ORDER — GABAPENTIN 300 MG PO CAPS: ORAL_CAPSULE | ORAL | Status: DC

## 2014-10-20 MED ORDER — PANTOPRAZOLE SODIUM 40 MG PO TBEC: 40.0000 mg | DELAYED_RELEASE_TABLET | Freq: Every day | ORAL | Status: DC

## 2014-10-20 NOTE — Patient Instructions (Signed)
1. Continue Elavil 10mg  at bedtime for now. 2. Start Neurontin. Day 1 take one daily. If tolerated, then Day 6 you may take one tablet twice daily. If tolerated, then Day 13 you will begin one table three times a day and continue that dose. 3. Return to see Dr. Oneida Alar, in four weeks. 4. We will start referral to [facility] as well. 5. Continue alternating protonix and Zantac as you have been doing.

## 2014-10-20 NOTE — Progress Notes (Signed)
Primary Care Physician: [name], MD  Primary Gastroenterologist:  Barney Drain, MD   Chief Complaint  Patient presents with  . burning in her mouth    HPI: Anna Gibson is a 69 y.o. 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 Initially seen for this back in November 2015. At that time she had a four-month history of extreme burning and pain involving the mouth which began after extensive dental work including multiple root canals, crowns, replaced feelings, fluoride treatments. Initially the burning was in the gums but this led to burning of the tongue and roof of the mouth. Symptoms are persistent and daily. Rarely without discomfort. She has been seen by her PCP, allergist, dentist, endodontist.  Biotene is helped temporarily. She had had some episodic flare of her typical heartburn when I last saw her. We started her on Dexilant one daily. Continued on Zantac 150 mg at bedtime. She ruled out for B6, B12, iron, folate, zinc deficiency. She had also had negative Sjogren's workup her PCP. She developed diarrhea on Dexilant and went back on Protonix for her GERD. She actually alternates Protonix and Zantac every 2 weeks stating she tolerates this regimen the best.   Since I last saw her did start her on Elavil, graduated dose starting at 10 mg at bedtime. She worked her way up to 30 mg at bedtime and was having near resolution of her burning mouth symptoms however then she noted some pimple-like places in her mouth. She stopped Elavil altogether with resolution of these sores. However the burning returned with a "vengeance". She therefore resumed Elavil at 10 mg at bedtime with some improvement of the burning but persistent symptoms overall.    Current Outpatient Prescriptions  Medication Sig Dispense Refill  . amitriptyline (ELAVIL) 10 MG tablet Start 10mg  orally at bedtime. May increase by 10mg  every 5 days until reach 50mg  at bedtime for treatment  of burning mouth syndrome. 100 tablet 0  . magnesium oxide (MAG-OX) 400 MG tablet Take 400 mg by mouth daily.    . Probiotic Product (PROBIOTIC DAILY PO) Take 1 tablet by mouth daily.    . psyllium (METAMUCIL) 58.6 % powder Take 1 packet by mouth daily. SUGAR FREE-ORANGE FLAVORED    . ranitidine (ZANTAC) 150 MG tablet Take 1 tablet (150 mg total) by mouth 2 (two) times daily. AS NEEDED. YOU SHOULD ALTERNATE WITH PRILOSEC. 30 tablet 3   No current facility-administered medications for this visit.    Allergies as of 10/20/2014 - Review Complete 10/20/2014  Allergen Reaction Noted  . Indomethacin Other (See Comments) 09/29/2011   Past Medical History  Diagnosis Date  . Hypertension   . HTN (hypertension), though recently low BP 09/29/2011   Past Surgical History  Procedure Laterality Date  . Cesarean section    . Cesarean section    . Colonoscopy  08/14/2007    Rare sigmoid colon diverticula/ A 2 cm hiatal hernia.  Multiple sessile gastric polyp / Screening colonoscopy in 10 years   . Esophagogastroduodenoscopy  08/14/2007     Normal duodenum/Gastritis is the likely cause for her abdominal pain  . Cholecystectomy    . Left heart catheterization with coronary angiogram N/A 09/29/2011    Procedure: LEFT HEART CATHETERIZATION WITH CORONARY ANGIOGRAM;  Surgeon: Lorretta Harp, MD;  Location: [facility] CATH LAB;  Service: Cardiovascular;  Laterality: N/A;   Family History  Problem Relation Age of Onset  . Congenital heart disease Mother   .  Coronary artery disease Brother    History   Social History  . Marital Status: Married    Spouse Name: N/A    Number of Children: N/A  . Years of Education: N/A   Social History Main Topics  . Smoking status: Never Smoker   . Smokeless tobacco: Never Used  . Alcohol Use: No  . Drug Use: No  . Sexual Activity: Yes   Other Topics Concern  . None   Social History Narrative    ROS:  General: Negative for anorexia, weight loss, fever, chills,  fatigue, weakness. ENT: Negative for hoarseness, difficulty swallowing , nasal congestion.CV: Negative for chest pain, angina, palpitations, dyspnea on exertion, peripheral edema.  Respiratory: Negative for dyspnea at rest, dyspnea on exertion, cough, sputum, wheezing.  GI: See history of present illness. GU:  Negative for dysuria, hematuria, urinary incontinence, urinary frequency, nocturnal urination.  Endo: Negative for unusual weight change.    Physical Examination:   BP 162/102 mmHg  Pulse 84  Temp(Src) 97.9 F (36.6 C)  Ht 5\' 2"  (1.575 m)  Wt 155 lb 12.8 oz (70.67 kg)  BMI 28.49 kg/m2  General: Well-nourished, well-developed in no acute distress.  Eyes: No icterus. Mouth: Oropharyngeal mucosa moist and pink. Single tiny erosion in lower buccal cavity along the left lower teeth. Lungs: Clear to auscultation bilaterally.  Heart: Regular rate and rhythm, no murmurs rubs or gallops.  Abdomen: Bowel sounds are normal, nontender, nondistended, no hepatosplenomegaly or masses, no abdominal bruits or hernia , no rebound or guarding.   Extremities: No lower extremity edema. No clubbing or deformities. Neuro: Alert and oriented x 4   Skin: Warm and dry, no jaundice.   Psych: Alert and cooperative, normal mood and affect.  Imaging Studies: No results found.

## 2014-10-21 ENCOUNTER — Other Ambulatory Visit: Payer: Self-pay

## 2014-10-21 DIAGNOSIS — K219 Gastro-esophageal reflux disease without esophagitis: Secondary | ICD-10-CM

## 2014-10-22 NOTE — Assessment & Plan Note (Signed)
Had noted complete resolution of symptoms on Elavil but intolerant to the medication at 30mg  daily. 10mg  daily does not control her symptoms.   Add neurontin 300mg  daily with goal of TID.   Will plan on OV with Dr. Oneida Alar for follow up at patient request. Also plan on GI referral to [facility] in case current regimen fails.

## 2014-10-22 NOTE — Assessment & Plan Note (Signed)
Well controlled on current alternating PPI/H2 blocker regimen. Patient did not notice any improvement in burning mouth on daily PPI and due to various side effects prefers alternating regimen.

## 2014-11-08 NOTE — Progress Notes (Signed)
CC'ED TO PCP 

## 2014-12-03 ENCOUNTER — Ambulatory Visit (INDEPENDENT_AMBULATORY_CARE_PROVIDER_SITE_OTHER): Payer: Medicare Other | Admitting: Gastroenterology

## 2014-12-03 ENCOUNTER — Encounter: Payer: Self-pay | Admitting: Gastroenterology

## 2014-12-03 VITALS — BP 148/92 | HR 64 | Temp 98.5°F | Ht 63.0 in | Wt 156.4 lb

## 2014-12-03 DIAGNOSIS — K146 Glossodynia: Secondary | ICD-10-CM

## 2014-12-03 NOTE — Progress Notes (Signed)
REVIEWED-NO ADDITIONAL RECOMMENDATIONS. 

## 2014-12-03 NOTE — Progress Notes (Signed)
Subjective:    Patient ID: Anna Gibson, female    DOB: 1946/03/15, 69 y.o.   MRN: [id number]  [name], MD  HPI FEELS LIKE AMITRIPTYLINE IS WORKING. STARTED IN GUMS THEN ON SIDES OF MOUTH AND ROOF OF MOUTH. NOW JUST ON THE TIP OF HER TONGUE. BMS: 1-2X/DAY. DID NOT TOLERATE NEURONTIN 300 MG BID DUE TO DROWSINESS/CONSTIPATION. METAMUCIL ONCE A DAY. HAD PALPITATIONS HER WHOLE LIFE AND HAD LOW Mg. INTERMITTENT PERIUMBILICAL ABD PAIN-?HENRNIA.  PT DENIES FEVER, CHILLS, HEMATOCHEZIA, Nausea, vomiting, melena, diarrhea, CHEST PAIN, SHORTNESS OF BREATH,  PALPITATIONS, problems swallowing, OR heartburn or indigestion.   Past Medical History  Diagnosis Date  . Hypertension   . HTN (hypertension), though recently low BP 09/29/2011   Past Surgical History  Procedure Laterality Date  . Cesarean section    . Cesarean section    . Colonoscopy  08/14/2007    Rare sigmoid colon diverticula/ A 2 cm hiatal hernia.  Multiple sessile gastric polyp / Screening colonoscopy in 10 years   . Esophagogastroduodenoscopy  08/14/2007     Normal duodenum/Gastritis is the likely cause for her abdominal pain  . Cholecystectomy    . Left heart catheterization with coronary angiogram N/A 09/29/2011    Procedure: LEFT HEART CATHETERIZATION WITH CORONARY ANGIOGRAM;  Surgeon: Lorretta Harp, MD;  Location: [facility] CATH LAB;  Service: Cardiovascular;  Laterality: N/A;   Past Surgical History  Procedure Laterality Date  . Cesarean section    . Cesarean section    . Colonoscopy  08/14/2007    Rare sigmoid colon diverticula/ A 2 cm hiatal hernia.  Multiple sessile gastric polyp / Screening colonoscopy in 10 years   . Esophagogastroduodenoscopy  08/14/2007     Normal duodenum/Gastritis is the likely cause for her abdominal pain  . Cholecystectomy    . Left heart catheterization with coronary angiogram N/A 09/29/2011    Procedure: LEFT HEART CATHETERIZATION WITH CORONARY ANGIOGRAM;  Surgeon: Lorretta Harp, MD;  Location: [facility] CATH LAB;  Service: Cardiovascular;  Laterality: N/A;   Allergies  Allergen Reactions  . Indomethacin Other (See Comments)    "sever headache"   Current Outpatient Prescriptions  Medication Sig Dispense Refill  . amitriptyline (ELAVIL) 10 MG tablet Take 1 tablet (10 mg total) by mouth at bedtime.    . magnesium oxide (MAG-OX) 400 MG tablet Take 400 mg by mouth daily.    . pantoprazole (PROTONIX) 40 MG tablet Take 1 tablet (40 mg total) by mouth daily.    . Probiotic Product (PROBIOTIC DAILY PO) Take 1 tablet by mouth daily.    . psyllium (METAMUCIL) 58.6 % powder Take 1 packet by mouth daily. SUGAR FREE-ORANGE FLAVORED    .      Marland Kitchen          Review of Systems     Objective:   Physical Exam  Constitutional: She is oriented to person, place, and time. She appears well-developed and well-nourished. No distress.  HENT:  Head: Normocephalic and atraumatic.  Mouth/Throat: Oropharynx is clear and moist. No oropharyngeal exudate.  Eyes: Pupils are equal, round, and reactive to light. No scleral icterus.  Neck: Normal range of motion. Neck supple.  Cardiovascular: Normal rate, regular rhythm and normal heart sounds.   Pulmonary/Chest: Effort normal and breath sounds normal. No respiratory distress.  Abdominal: Soft. Bowel sounds are normal. She exhibits no distension. There is no tenderness.  Musculoskeletal: She exhibits no edema.  Lymphadenopathy:    She has no cervical adenopathy.  Neurological: She is alert and oriented to person, place, and time.  Psychiatric: She has a normal mood and affect.  Vitals reviewed.         Assessment & Plan:

## 2014-12-03 NOTE — Patient Instructions (Signed)
COMPLETE YOUR ELAVIL May 19 2015 .  FOLLOW UP IN OCT 2016.  PLEASE CALL WITH QUESTIONS OR CONCERNS.

## 2014-12-03 NOTE — Progress Notes (Signed)
ON RECALL LIST  °

## 2014-12-03 NOTE — Assessment & Plan Note (Signed)
SYMPTOMS IMPROVED.  COMPLETE ELAVIL May 19 2015 FOLLOW UP IN OCT 2016. CALL WITH QUESTIONS OR CONCERNS.

## 2014-12-03 NOTE — Progress Notes (Signed)
cc'ed to pcp °

## 2015-01-27 DIAGNOSIS — H52223 Regular astigmatism, bilateral: Secondary | ICD-10-CM | POA: Diagnosis not present

## 2015-01-27 DIAGNOSIS — H5202 Hypermetropia, left eye: Secondary | ICD-10-CM | POA: Diagnosis not present

## 2015-01-27 DIAGNOSIS — H524 Presbyopia: Secondary | ICD-10-CM | POA: Diagnosis not present

## 2015-01-27 DIAGNOSIS — H2513 Age-related nuclear cataract, bilateral: Secondary | ICD-10-CM | POA: Diagnosis not present

## 2015-03-24 DIAGNOSIS — Z1389 Encounter for screening for other disorder: Secondary | ICD-10-CM | POA: Diagnosis not present

## 2015-03-24 DIAGNOSIS — D485 Neoplasm of uncertain behavior of skin: Secondary | ICD-10-CM | POA: Diagnosis not present

## 2015-03-24 DIAGNOSIS — Z23 Encounter for immunization: Secondary | ICD-10-CM | POA: Diagnosis not present

## 2015-03-24 DIAGNOSIS — L989 Disorder of the skin and subcutaneous tissue, unspecified: Secondary | ICD-10-CM | POA: Diagnosis not present

## 2015-03-24 DIAGNOSIS — E785 Hyperlipidemia, unspecified: Secondary | ICD-10-CM | POA: Diagnosis not present

## 2015-04-21 ENCOUNTER — Encounter: Payer: Self-pay | Admitting: Gastroenterology

## 2015-06-15 DIAGNOSIS — Z23 Encounter for immunization: Secondary | ICD-10-CM | POA: Diagnosis not present

## 2015-06-29 DIAGNOSIS — M5136 Other intervertebral disc degeneration, lumbar region: Secondary | ICD-10-CM | POA: Diagnosis not present

## 2015-06-29 DIAGNOSIS — M4316 Spondylolisthesis, lumbar region: Secondary | ICD-10-CM | POA: Diagnosis not present

## 2015-06-29 DIAGNOSIS — R030 Elevated blood-pressure reading, without diagnosis of hypertension: Secondary | ICD-10-CM | POA: Diagnosis not present

## 2015-07-15 ENCOUNTER — Ambulatory Visit (INDEPENDENT_AMBULATORY_CARE_PROVIDER_SITE_OTHER): Payer: Medicare Other | Admitting: Gastroenterology

## 2015-07-15 ENCOUNTER — Encounter: Payer: Self-pay | Admitting: Gastroenterology

## 2015-07-15 VITALS — BP 180/98 | HR 62 | Temp 97.3°F | Ht 63.0 in | Wt 159.4 lb

## 2015-07-15 DIAGNOSIS — K589 Irritable bowel syndrome without diarrhea: Secondary | ICD-10-CM | POA: Diagnosis not present

## 2015-07-15 DIAGNOSIS — K146 Glossodynia: Secondary | ICD-10-CM

## 2015-07-15 DIAGNOSIS — K219 Gastro-esophageal reflux disease without esophagitis: Secondary | ICD-10-CM

## 2015-07-15 MED ORDER — CITALOPRAM HYDROBROMIDE 10 MG PO TABS: 10.0000 mg | ORAL_TABLET | Freq: Every day | ORAL | Status: DC

## 2015-07-15 NOTE — Assessment & Plan Note (Signed)
SYMPTOMS NOT IDEALLY CONTROLLED ON ZANTAC. NOW TAKING PROTONIX QAM.  DISCUSSED PROCEDURE, BENEFITS, RISKS, AND MANAGEMENT OF GERD. EXPLAINED TO PT AND HER HUSBAND BENEFITS OF PROTONIX FAR OUTWEIGH THEORETICAL RISKS. CONTINUE PROTONIX. TAKE 30 MINUTES PRIOR TO BREAKFAST. ZANTAC HELPS MOST WHEN USED AS NEEDED. OPV IN 3 MOS

## 2015-07-15 NOTE — Patient Instructions (Signed)
ADD CELEXA EVERY MORNING.  CONTINUE PROTONIX. TAKE 30 MINUTES PRIOR TO BREAKFAST.  ZANTAC HELPS MOST WHEN USED AS NEEDED.  CONTINUE YOUR PROBIOTIC.  PLEASE CALL IN ONE MONTH IF YOUR BURNING MOUTH AND ABDOMINAL PAIN ARE NOT BETTER.  FOLLOW UP IN 3 MOS.

## 2015-07-15 NOTE — Progress Notes (Signed)
ON RECALL  °

## 2015-07-15 NOTE — Progress Notes (Signed)
cc'ed to pcp °

## 2015-07-15 NOTE — Assessment & Plan Note (Addendum)
SYMPTOMS NOT CONTROLLED. MAY BE DUE TO GERD AND/OR PSYCHOSOCIAL STRESSORS.  ADD CELEXA EVERY MORNING. CONTINUE PROTONIX. TAKE 30 MINUTES PRIOR TO BREAKFAST. ZANTAC HELPS MOST WHEN USED AS NEEDED. CONTINUE YOUR PROBIOTIC. CALL IN ONE MONTH IF BURNING MOUTH AND ABDOMINAL PAIN ARE NOT BETTER. PT DECLINED REFERRAL TO [facility] ENT AND/OR GI DEPT AT THIS TIME. FOLLOW UP IN 3 MOS.

## 2015-07-15 NOTE — Progress Notes (Signed)
Subjective:    Patient ID: Anna Gibson, female    DOB: 04/07/1946, 69 y.o.   MRN: [id number]  Anna Morel, MD  HPI TROUBLE WITH STOMACH SINCE AGE 65. FEELS LIKE THEY HAVE GOTTEN WORSE.HAVING PROBLEMS WITH BURNING MOUTH GETS WORSE OVER THE DAY  AND THEN TAPERS OFF. SUGARLESS GUM HELPS WITH THE BURNING MOUTH. HESITANT TO TAKE PROTONIX DUE TO POTENTIAL SIDE EFFECTS. NOW TAKING MORE REGULAR. LAST NIGHT DIDN'T SLEEP DUE TO BACK PAIN. HAVING PROBLEMS WITH FAMILY STRESS: ANXIETY/DEPRESSION. TRIED ELAVIL BUT MADE HER CRAZY/SPACY ALL BY ITSELF. TRIED HUSBAND CELEXA AND IT CALMED HER DOWN. BMs: 1-2X/DAY(FORMED). RARE IF SHE HAS LOWER ABDOMINAL PAIN(OFF PROTONIX, ON ZANTAC) HAS MUCOUSY STOOLS BUT STILL FORMED. HEARTBURN:  IF SHE EATS. DOESN'T MATTER WHAT SHE EATS. NEVER TRIED A GLUTEN FREE DIET. MAY HAVE INTERMITTENT DIARRHEA AND ABDOMINAL PAIN FOR HALF A DAY. HITS ALL AT ONCE AND LAST UP TO HALF A DAY. MAY HAPPEN MORE OFTEN IF SHE GOES OUT TO EAT.  PT DENIES FEVER, CHILLS, HEMATOCHEZIA, nausea, vomiting, melena, diarrhea, CHEST PAIN, SHORTNESS OF BREATH,  CHANGE IN BOWEL IN HABITS, constipation, problems swallowing, OR heartburn or indigestion.  Past Medical History  Diagnosis Date  . Hypertension   . HTN (hypertension), though recently low BP 09/29/2011  . Burning sensation of mouth 08/13/2014   Past Surgical History  Procedure Laterality Date  . Cesarean section    . Cesarean section    . Colonoscopy  08/14/2007    Rare sigmoid colon diverticula/ A 2 cm hiatal hernia.  Multiple sessile gastric polyp / Screening colonoscopy in 10 years   . Esophagogastroduodenoscopy  08/14/2007     Normal duodenum/Gastritis is the likely cause for her abdominal pain  . Cholecystectomy    . Left heart catheterization with coronary angiogram N/A 09/29/2011    Procedure: LEFT HEART CATHETERIZATION WITH CORONARY ANGIOGRAM;  Surgeon: Lorretta Harp, MD;  Location: [facility] CATH LAB;  Service: Cardiovascular;   Laterality: N/A;   Allergies  Allergen Reactions  . Indomethacin Other (See Comments)    "sever headache"   Current Outpatient Prescriptions  Medication Sig Dispense Refill  . magnesium oxide (MAG-OX) 400 MG tablet Take 400 mg by mouth daily.    . pantoprazole (PROTONIX) 40 MG tablet Take 1 tablet (40 mg total) by mouth daily.    . Probiotic Product (PROBIOTIC DAILY PO) Take 1 tablet by mouth daily.    . psyllium (METAMUCIL) 58.6 % powder Take 1 packet by mouth daily. SUGAR FREE-ORANGE FLAVORED    .      Marland Kitchen       Review of Systems PER HPI OTHERWISE ALL SYSTEMS ARE NEGATIVE.    Objective:   Physical Exam  Constitutional: She is oriented to person, place, and time. She appears well-developed and well-nourished. No distress.  HENT:  Head: Normocephalic and atraumatic.  Mouth/Throat: Oropharynx is clear and moist. No oropharyngeal exudate.  Eyes: Pupils are equal, round, and reactive to light. No scleral icterus.  Neck: Normal range of motion. Neck supple.  Cardiovascular: Normal rate, regular rhythm and normal heart sounds.   Pulmonary/Chest: Effort normal and breath sounds normal. No respiratory distress.  Abdominal: Soft. Bowel sounds are normal. She exhibits no distension. There is no tenderness.  Musculoskeletal: She exhibits no edema.  Lymphadenopathy:    She has no cervical adenopathy.  Neurological: She is alert and oriented to person, place, and time.  NO FOCAL DEFICITS   Psychiatric:  FLAT AFFECT, SLIGHTLY ANXIOUS MOOD  Vitals  reviewed.         Assessment & Plan:

## 2015-07-15 NOTE — Assessment & Plan Note (Signed)
SYMPTOMS FAIRLY WELL CONTROLLED.  ADD CELEXA EVERY MORNING. PROBIOTIC DAILY IMODIUM PRN FOLLOW UP IN 3 MOS.

## 2015-07-21 DIAGNOSIS — M5136 Other intervertebral disc degeneration, lumbar region: Secondary | ICD-10-CM | POA: Diagnosis not present

## 2015-07-21 DIAGNOSIS — M4316 Spondylolisthesis, lumbar region: Secondary | ICD-10-CM | POA: Diagnosis not present

## 2015-08-11 DIAGNOSIS — M545 Low back pain: Secondary | ICD-10-CM | POA: Diagnosis not present

## 2015-09-08 ENCOUNTER — Other Ambulatory Visit: Payer: Self-pay | Admitting: Gastroenterology

## 2015-09-08 DIAGNOSIS — R030 Elevated blood-pressure reading, without diagnosis of hypertension: Secondary | ICD-10-CM | POA: Diagnosis not present

## 2015-09-08 DIAGNOSIS — M4316 Spondylolisthesis, lumbar region: Secondary | ICD-10-CM | POA: Diagnosis not present

## 2015-09-08 DIAGNOSIS — M5136 Other intervertebral disc degeneration, lumbar region: Secondary | ICD-10-CM | POA: Diagnosis not present

## 2015-10-14 ENCOUNTER — Encounter: Payer: Self-pay | Admitting: Gastroenterology

## 2015-10-14 ENCOUNTER — Ambulatory Visit (INDEPENDENT_AMBULATORY_CARE_PROVIDER_SITE_OTHER): Payer: Medicare Other | Admitting: Gastroenterology

## 2015-10-14 VITALS — BP 149/88 | HR 75 | Temp 98.2°F | Ht 63.0 in | Wt 161.8 lb

## 2015-10-14 DIAGNOSIS — K589 Irritable bowel syndrome without diarrhea: Secondary | ICD-10-CM

## 2015-10-14 DIAGNOSIS — K219 Gastro-esophageal reflux disease without esophagitis: Secondary | ICD-10-CM | POA: Diagnosis not present

## 2015-10-14 DIAGNOSIS — K146 Glossodynia: Secondary | ICD-10-CM | POA: Diagnosis not present

## 2015-10-14 DIAGNOSIS — R1310 Dysphagia, unspecified: Secondary | ICD-10-CM | POA: Diagnosis not present

## 2015-10-14 MED ORDER — PANTOPRAZOLE SODIUM 40 MG PO TBEC: 40.0000 mg | DELAYED_RELEASE_TABLET | Freq: Every day | ORAL | Status: DC

## 2015-10-14 MED ORDER — CITALOPRAM HYDROBROMIDE 10 MG PO TABS: 10.0000 mg | ORAL_TABLET | Freq: Every day | ORAL | Status: DC

## 2015-10-14 MED ORDER — RANITIDINE HCL 150 MG PO TABS: 150.0000 mg | ORAL_TABLET | Freq: Two times a day (BID) | ORAL | Status: DC

## 2015-10-14 NOTE — Assessment & Plan Note (Signed)
SYMPTOMS FAIRLY WELL CONTROLLED.  PT CONCERNED ABOUT SIDE EFFECTS OR PROTONIX-PALPITATIONS, LOW MAGNESIUM DISCUSSED PROTONIX, BENEFITS, RISKS, AND MANAGEMENT OF GERD WITH PT AN DHUSBAND. PT WILL USE ZANTAC EVERY MORNING AND TAKE A SECOND DOSE IF NEEDED. SHE CAN ALTERNATE OR USE PROTONIX IN ADDITION TO THE ZANTAC.  CALL WITH QUESTIONS OR CONCERNS REGARDING YOUR BURNING MOUTH OR ANY OTHER GI NEEDS.  FOLLOW UP IN 6 MOS.

## 2015-10-14 NOTE — Progress Notes (Signed)
   Subjective:    Patient ID: Anna Gibson, female    DOB: 04-Nov-1945, 70 y.o.   MRN: [id number]  [name], MD   HPI MOUTH BURNS SOME BUT IT'S BETTER. SINCE LAST VISIT STAYS ON PROTONIX. HEART PALPITATIONS IN PAST FEW DAYS. TOOK SOME ALEVE FOR 2-3 DAYS. TOOK ZANTAC THIS AM AND MIGHT TAKE SECOND DOES. PROTONIX KEEPS STOMACH UNDER CONTROL. HASN'T SEEN CARDIOLOGIST LATELY. LAST VISIT > 1 YEAR. LAST TIME RECOMMENDED SHE TAKE MG SUPPLEMENTS. AFFECTED BY ARTHRITIS IN BACK, LEGS, AND FEET. HAD A KENNEL AND HORSES IN THE PAST. HAD 2 EPIDURAL IN HER BACK SINCE LAST VISIT. BMS: HOOD. APPETITE: TOO GOOD.   PT DENIES FEVER, CHILLS, HEMATOCHEZIA, HEMATEMESIS, nausea, vomiting, melena, diarrhea, CHEST PAIN, SHORTNESS OF BREATH,  CHANGE IN BOWEL IN HABITS, constipation, abdominal pain, problems swallowing, OR heartburn or indigestion.   Past Medical History  Diagnosis Date  . Hypertension   . HTN (hypertension), though recently low BP 09/29/2011  . Burning sensation of mouth 08/13/2014   Past Surgical History  Procedure Laterality Date  . Cesarean section    . Cesarean section    . Colonoscopy  08/14/2007    Rare sigmoid colon diverticula/ A 2 cm hiatal hernia.  Multiple sessile gastric polyp / Screening colonoscopy in 10 years   . Esophagogastroduodenoscopy  08/14/2007     Normal duodenum/Gastritis is the likely cause for her abdominal pain  . Cholecystectomy    . Left heart catheterization with coronary angiogram N/A 09/29/2011    Procedure: LEFT HEART CATHETERIZATION WITH CORONARY ANGIOGRAM;  Surgeon: Lorretta Harp, MD;  Location: [facility] CATH LAB;  Service: Cardiovascular;  Laterality: N/A;   Allergies  Allergen Reactions  . Indomethacin Other (See Comments)    "sever headache"   Current Outpatient Prescriptions  Medication Sig Dispense Refill  . amitriptyline (ELAVIL) 10 MG tablet Take 1 tablet (10 mg total) by mouth at bedtime.    . citalopram (CELEXA) 10 MG tablet Take 1  tablet (10 mg total) by mouth daily.    Marland Kitchen MAG-OX 400 MG tablet Take 400 mg by mouth daily.    . pantoprazole (PROTONIX) 40 MG tablet TAKE ONE TABLET BY MOUTH DAILY    . Probiotic DAILY PO) Take 1 tablet by mouth daily.    . psyllium (METAMUCIL) 58.6 % powder Take 1 packet by mouth daily. SUGAR FREE-ORANGE FLAVORED    . ranitidine (ZANTAC) 150 MG tablet Take 1 tablet (150 mg total) by mouth 2 (two) times daily. AS NEEDED. YOU SHOULD ALTERNATE WITH PRILOSEC.      Review of Systems PER HPI OTHERWISE ALL SYSTEMS ARE NEGATIVE.     Objective:   Physical Exam        Assessment & Plan:

## 2015-10-14 NOTE — Progress Notes (Signed)
CC'ED TO PCP 

## 2015-10-14 NOTE — Patient Instructions (Signed)
CONTINUE CELEXA EVERY MORNING.  USE ZANTAC EVERY MORNING AND TAKE A SECOND DOSE IF NEEDED. YOU CAN ALTERNATE OR USE PROTONIX IN ADDITION TO THE ZANTAC.   CONTINUE YOUR PROBIOTIC.  PLEASE CALL WITH QUESTIONS OR CONCERNS REGARDING YOUR BURNING MOUTH OR ANY OTHER GI NEEDS.   FOLLOW UP IN 6 MOS.

## 2015-10-14 NOTE — Assessment & Plan Note (Addendum)
SYMPTOMS FAIRLY WELL CONTROLLED ON CELEXA.  CONTINUE TO MONITOR SYMPTOMS. CONTINUE PROBIOTICS. FOLLOW UP IN 6 MOS.

## 2015-10-14 NOTE — Assessment & Plan Note (Signed)
SYMPTOMS CONTROLLED/RESOLVED.  CONTINUE TO MONITOR SYMPTOMS. 

## 2015-10-14 NOTE — Assessment & Plan Note (Signed)
SYMPTOMS FAIRLY WELL CONTROLLED ON  CELEXA.  CONTINUE TO MONITOR SYMPTOMS. CONTINUE CELEXA. CONSIDER INCREASING DOSE IF NEEDED.

## 2015-10-14 NOTE — Progress Notes (Signed)
ON RECALL  °

## 2015-11-02 DIAGNOSIS — D225 Melanocytic nevi of trunk: Secondary | ICD-10-CM | POA: Diagnosis not present

## 2015-11-02 DIAGNOSIS — D0461 Carcinoma in situ of skin of right upper limb, including shoulder: Secondary | ICD-10-CM | POA: Diagnosis not present

## 2015-11-23 DIAGNOSIS — M5136 Other intervertebral disc degeneration, lumbar region: Secondary | ICD-10-CM | POA: Diagnosis not present

## 2015-11-23 DIAGNOSIS — M4316 Spondylolisthesis, lumbar region: Secondary | ICD-10-CM | POA: Diagnosis not present

## 2015-12-03 DIAGNOSIS — Z85828 Personal history of other malignant neoplasm of skin: Secondary | ICD-10-CM | POA: Diagnosis not present

## 2015-12-03 DIAGNOSIS — Z08 Encounter for follow-up examination after completed treatment for malignant neoplasm: Secondary | ICD-10-CM | POA: Diagnosis not present

## 2015-12-21 DIAGNOSIS — R030 Elevated blood-pressure reading, without diagnosis of hypertension: Secondary | ICD-10-CM | POA: Diagnosis not present

## 2015-12-21 DIAGNOSIS — M5136 Other intervertebral disc degeneration, lumbar region: Secondary | ICD-10-CM | POA: Diagnosis not present

## 2015-12-21 DIAGNOSIS — M4316 Spondylolisthesis, lumbar region: Secondary | ICD-10-CM | POA: Diagnosis not present

## 2015-12-21 DIAGNOSIS — M7061 Trochanteric bursitis, right hip: Secondary | ICD-10-CM | POA: Diagnosis not present

## 2016-02-25 ENCOUNTER — Encounter: Payer: Self-pay | Admitting: Gastroenterology

## 2016-02-25 DIAGNOSIS — M4316 Spondylolisthesis, lumbar region: Secondary | ICD-10-CM | POA: Diagnosis not present

## 2016-02-25 DIAGNOSIS — M5136 Other intervertebral disc degeneration, lumbar region: Secondary | ICD-10-CM | POA: Diagnosis not present

## 2016-03-31 DIAGNOSIS — R946 Abnormal results of thyroid function studies: Secondary | ICD-10-CM | POA: Diagnosis not present

## 2016-03-31 DIAGNOSIS — E782 Mixed hyperlipidemia: Secondary | ICD-10-CM | POA: Diagnosis not present

## 2016-03-31 DIAGNOSIS — Z0000 Encounter for general adult medical examination without abnormal findings: Secondary | ICD-10-CM | POA: Diagnosis not present

## 2016-03-31 DIAGNOSIS — Z1389 Encounter for screening for other disorder: Secondary | ICD-10-CM | POA: Diagnosis not present

## 2016-04-04 DIAGNOSIS — M4316 Spondylolisthesis, lumbar region: Secondary | ICD-10-CM | POA: Diagnosis not present

## 2016-04-04 DIAGNOSIS — M5136 Other intervertebral disc degeneration, lumbar region: Secondary | ICD-10-CM | POA: Diagnosis not present

## 2016-05-19 DIAGNOSIS — M5136 Other intervertebral disc degeneration, lumbar region: Secondary | ICD-10-CM | POA: Diagnosis not present

## 2016-05-19 DIAGNOSIS — M4316 Spondylolisthesis, lumbar region: Secondary | ICD-10-CM | POA: Diagnosis not present

## 2016-05-25 ENCOUNTER — Encounter: Payer: Self-pay | Admitting: Gastroenterology

## 2016-05-25 ENCOUNTER — Ambulatory Visit (INDEPENDENT_AMBULATORY_CARE_PROVIDER_SITE_OTHER): Payer: Medicare Other | Admitting: Gastroenterology

## 2016-05-25 DIAGNOSIS — K219 Gastro-esophageal reflux disease without esophagitis: Secondary | ICD-10-CM | POA: Diagnosis not present

## 2016-05-25 DIAGNOSIS — K146 Glossodynia: Secondary | ICD-10-CM | POA: Diagnosis not present

## 2016-05-25 MED ORDER — RANITIDINE HCL 150 MG PO TABS: 150.0000 mg | ORAL_TABLET | Freq: Two times a day (BID) | ORAL | 3 refills | Status: DC

## 2016-05-25 MED ORDER — PANTOPRAZOLE SODIUM 40 MG PO TBEC: 40.0000 mg | DELAYED_RELEASE_TABLET | Freq: Every day | ORAL | 3 refills | Status: DC

## 2016-05-25 MED ORDER — CITALOPRAM HYDROBROMIDE 10 MG PO TABS: 10.0000 mg | ORAL_TABLET | Freq: Every day | ORAL | 3 refills | Status: DC

## 2016-05-25 NOTE — Progress Notes (Signed)
CC'ED TO PCP 

## 2016-05-25 NOTE — Patient Instructions (Signed)
CONTINUE PROTONIX. TAKE 30 MINUTES PRIOR TO BREAKFAST.   ZANTAC HELP MOST WHEN USED AS NEEDED.  TAKE CALCIUM WITH VITAMIN D THREE TIMES A DAY.  FOLLOW UP IN 6 MOS. MERRY CHRISTMAS AND HAPPY NEW YEAR!  PLEASE CALL WITH QUESTIONS OR CONCERNS.

## 2016-05-25 NOTE — Progress Notes (Signed)
ON RECALL  °

## 2016-05-25 NOTE — Progress Notes (Signed)
   Subjective:    Patient ID: Anna Gibson, female    DOB: 02-Aug-1946, 70 y.o.   MRN: [id number]  HPI MOUTH WAS BETTER FOR A WHILE. KEEPS A PICE OF SUGARLESS GUM AND ALLEVIATES BURNING SOME. USING PROTONIX DAILY. ZANTAC PRN. HAD A SPINE INJECTION LAST WEEK. FEELS BETTER ALL OVER WHEN SHE GETS IT. GETS ONE Q4 MOS. ABLE TO RIDE A HORSE LATELY. NO NEW RX OR OTC MEDS. BMS:1-2X/DAY-NL. WATERY STOOLS IF SHE GOES OFF PROTONIX.  PT DENIES FEVER, CHILLS, HEMATOCHEZIA, HEMATEMESIS, nausea, vomiting, melena, diarrhea, CHEST PAIN, SHORTNESS OF BREATH, CHANGE IN BOWEL IN HABITS, constipation, abdominal pain, problems swallowing, OR heartburn or indigestion.   Past Medical History:  Diagnosis Date  . Burning sensation of mouth 08/13/2014  . HTN (hypertension), though recently low BP 09/29/2011  . Hypertension    Past Surgical History:  Procedure Laterality Date  . CESAREAN SECTION    . CESAREAN SECTION    . CHOLECYSTECTOMY    . COLONOSCOPY  08/14/2007   Rare sigmoid colon diverticula/ A 2 cm hiatal hernia.  Multiple sessile gastric polyp / Screening colonoscopy in 10 years   . ESOPHAGOGASTRODUODENOSCOPY  08/14/2007    Normal duodenum/Gastritis is the likely cause for her abdominal pain  . LEFT HEART CATHETERIZATION WITH CORONARY ANGIOGRAM N/A 09/29/2011   Procedure: LEFT HEART CATHETERIZATION WITH CORONARY ANGIOGRAM;  Surgeon: Lorretta Harp, MD;  Location: [facility] CATH LAB;  Service: Cardiovascular;  Laterality: N/A;   Allergies  Allergen Reactions  . Indomethacin Other (See Comments)    "sever headache"   Current Outpatient Prescriptions  Medication Sig Dispense Refill  . citalopram (CELEXA) 10 MG tablet Take 1 tablet (10 mg total) by mouth daily.    . magnesium oxide (MAG-OX) 400 MG tablet Take 400 mg by mouth daily.    . pantoprazole (PROTONIX) 40 MG tablet Take 1 tablet (40 mg total) by mouth daily.    . Probiotic Product (PROBIOTIC DAILY PO) Take 1 tablet by mouth daily.    . psyllium  (METAMUCIL) 58.6 % powder Take 1 packet by mouth daily. SUGAR FREE-ORANGE FLAVORED EVERY DAY   . ranitidine (ZANTAC) 150 MG tablet BID PRN AS NEEDED. YOU SHOULD ALTERNATE WITH PRILOSEC.    .       Review of Systems PER HPI OTHERWISE ALL SYSTEMS ARE NEGATIVE.    Objective:   Physical Exam        Assessment & Plan:

## 2016-05-25 NOTE — Assessment & Plan Note (Signed)
SYMPTOMS FAIRLY WELL CONTROLLED ON CELEXA.  REFILL CELEXA X 1 YEAR CONTINUE TO MONITOR SYMPTOMS. FOLLOW UP IN 6 MOS.

## 2016-05-25 NOTE — Assessment & Plan Note (Signed)
SYMPTOMS CONTROLLED/RESOLVED.   CONTINUE PROTONIX. TAKE 30 MINUTES PRIOR TO BREAKFAST. ZANTAC PRN FOLLOW UP IN 6 MOS.

## 2016-06-21 DIAGNOSIS — M4316 Spondylolisthesis, lumbar region: Secondary | ICD-10-CM | POA: Diagnosis not present

## 2016-06-21 DIAGNOSIS — M5136 Other intervertebral disc degeneration, lumbar region: Secondary | ICD-10-CM | POA: Diagnosis not present

## 2016-07-19 DIAGNOSIS — Z23 Encounter for immunization: Secondary | ICD-10-CM | POA: Diagnosis not present

## 2016-07-19 DIAGNOSIS — I10 Essential (primary) hypertension: Secondary | ICD-10-CM | POA: Diagnosis not present

## 2016-07-19 DIAGNOSIS — R946 Abnormal results of thyroid function studies: Secondary | ICD-10-CM | POA: Diagnosis not present

## 2016-07-19 DIAGNOSIS — E079 Disorder of thyroid, unspecified: Secondary | ICD-10-CM | POA: Diagnosis not present

## 2016-07-19 DIAGNOSIS — E784 Other hyperlipidemia: Secondary | ICD-10-CM | POA: Diagnosis not present

## 2016-09-08 DIAGNOSIS — M5136 Other intervertebral disc degeneration, lumbar region: Secondary | ICD-10-CM | POA: Diagnosis not present

## 2016-09-08 DIAGNOSIS — M4316 Spondylolisthesis, lumbar region: Secondary | ICD-10-CM | POA: Diagnosis not present

## 2016-10-17 DIAGNOSIS — M4316 Spondylolisthesis, lumbar region: Secondary | ICD-10-CM | POA: Diagnosis not present

## 2016-10-17 DIAGNOSIS — R030 Elevated blood-pressure reading, without diagnosis of hypertension: Secondary | ICD-10-CM | POA: Diagnosis not present

## 2016-10-27 ENCOUNTER — Encounter: Payer: Self-pay | Admitting: Gastroenterology

## 2016-12-12 DIAGNOSIS — M5136 Other intervertebral disc degeneration, lumbar region: Secondary | ICD-10-CM | POA: Diagnosis not present

## 2016-12-12 DIAGNOSIS — M4316 Spondylolisthesis, lumbar region: Secondary | ICD-10-CM | POA: Diagnosis not present

## 2016-12-14 ENCOUNTER — Other Ambulatory Visit: Payer: Self-pay

## 2016-12-14 ENCOUNTER — Ambulatory Visit (INDEPENDENT_AMBULATORY_CARE_PROVIDER_SITE_OTHER): Payer: Medicare Other | Admitting: Gastroenterology

## 2016-12-14 ENCOUNTER — Encounter: Payer: Self-pay | Admitting: Gastroenterology

## 2016-12-14 DIAGNOSIS — K582 Mixed irritable bowel syndrome: Secondary | ICD-10-CM | POA: Diagnosis not present

## 2016-12-14 DIAGNOSIS — K146 Glossodynia: Secondary | ICD-10-CM | POA: Diagnosis not present

## 2016-12-14 DIAGNOSIS — R1319 Other dysphagia: Secondary | ICD-10-CM

## 2016-12-14 DIAGNOSIS — R1310 Dysphagia, unspecified: Secondary | ICD-10-CM | POA: Diagnosis not present

## 2016-12-14 DIAGNOSIS — K625 Hemorrhage of anus and rectum: Secondary | ICD-10-CM

## 2016-12-14 DIAGNOSIS — K219 Gastro-esophageal reflux disease without esophagitis: Secondary | ICD-10-CM | POA: Diagnosis not present

## 2016-12-14 MED ORDER — CITALOPRAM HYDROBROMIDE 20 MG PO TABS: 20.0000 mg | ORAL_TABLET | Freq: Every day | ORAL | 3 refills | Status: DC

## 2016-12-14 NOTE — Patient Instructions (Addendum)
Continue CELEXA AT 20 MG DAILY. PLEASE CALL WITH QUESTIONS OR CONCERNS.  COMPLETE FLEXIBLE SIGMOIDOSCOPY/ HEMORRHOID BANDING ON APR 13.  DRINK WATER TO KEEP YOUR URINE LIGHT YELLOW.  FOLLOW A HIGH FIBER DIET. AVOID ITEMS THAT CAUSE BLOATING & GAS. SEE INFO BELOW.  FOLLOW UP IN 6 MOS.     HEMORRHOIDAL BANDING COMPLICATIONS:  COMMON: 1. MINOR PAIN  UNCOMMON: 1. ABSCESS 2. BAND FALLS OFF 3. PROLAPSE OF HEMORRHOIDS AND PAIN 4. ULCER BLEEDING  A. USUALLY SELF-LIMITED: MAY LAST 3-5 DAYS  B. MAY REQUIRE INTERVENTION: 1-2 WEEKS AFTER INTERVENTION 5. NECROTIZING PELVIC SEPSIS  A. SYMPTOMS: FEVER, PAIN, DIFFICULTY URINATING

## 2016-12-14 NOTE — Progress Notes (Signed)
Subjective:    Patient ID: Anna Gibson, female    DOB: [birth year], 71 y.o.   MRN: [id number]  Purvis Kilts, MD  HPI Has good days and bad days. AMY TAKE PROTONIX FOR 3 WEEKS AND THEN QUIT. STARTS TAKING IT WHEN HER STOMACH GETS BAD. IF IT ISN'T THEN SHE WILL TAKE ZANTAC. BAD EPISODE: 1X/MO. EPISODES: HEARTBURN, QUICK DIARRHEA, BUBBLY AND GETS SEVERE ABDOMINAL CRAMPS.  MAY ALSO SEE MUCOUS IN HER STOOL. FEELS PRESSURE AND MAY CAUSE RECTAL URGENCY. MAY HAVE RECTAL PAIN AND SOILING. Burning mouth is better but feels like she could tolerate a higher dose of CELEXA. STILL FEELS BURNING AT THE END OF HER TONGUE.  BACK STILL BOTHERING HER AND JUST HAD AN INJECTION. HAVING HEMORRHOID TROUBLE AND STAYED WITH HER SINCE HER SON WAS BORN. EVERY TIME SHE GOES TO BATHROOM IT BLEEDS. FEELS IT DROPS DOWN AND STAYS PUSHES IT BACK UP IN BUT IT FALLS BACK OUT NOW. USED TO WORK. BOWELS MOVING GOOD.   PT DENIES FEVER, CHILLS, HEMATEMESIS, nausea, vomiting, melena,  CHEST PAIN, SHORTNESS OF BREATH, CHANGE IN BOWEL IN HABITS, constipation, OR problems swallowing.  Past Medical History:  Diagnosis Date  . Burning sensation of mouth 08/13/2014  . HTN (hypertension), though recently low BP 09/29/2011  . Hypertension    Past Surgical History:  Procedure Laterality Date  . CESAREAN SECTION    . CESAREAN SECTION    . CHOLECYSTECTOMY    . COLONOSCOPY  08/14/2007   Rare sigmoid colon diverticula/ A 2 cm hiatal hernia.  Multiple sessile gastric polyp / Screening colonoscopy in 10 years   . ESOPHAGOGASTRODUODENOSCOPY  08/14/2007    Normal duodenum/Gastritis is the likely cause for her abdominal pain  . LEFT HEART CATHETERIZATION WITH CORONARY ANGIOGRAM N/A 09/29/2011   Procedure: LEFT HEART CATHETERIZATION WITH CORONARY ANGIOGRAM;  Surgeon: Lorretta Harp, MD;  Location: [facility] CATH LAB;  Service: Cardiovascular;  Laterality: N/A;   Allergies  Allergen Reactions  . Indomethacin Other (See Comments)    "sever  headache"   Current Outpatient Prescriptions  Medication Sig Dispense Refill  . citalopram (CELEXA) 10 MG tablet Take 1 tablet (10 mg total) by mouth daily.    . magnesium oxide (MAG-OX) 400 MG tablet Take 400 mg by mouth daily.    . pantoprazole (PROTONIX) 40 MG tablet Take 1 tablet (40 mg total) by mouth daily.    . Probiotic Product (PROBIOTIC DAILY PO) Take 1 tablet by mouth daily.    . psyllium (METAMUCIL) 58.6 % powder Take 1 packet by mouth daily. SUGAR FREE-ORANGE FLAVORED    . ranitidine (ZANTAC) 150 MG tablet Take 1 tablet (150 mg total) by mouth 2 (two) times daily. AS NEEDED. YOU SHOULD ALTERNATE WITH PRILOSEC.    .       Review of Systems PER HPI OTHERWISE ALL SYSTEMS ARE NEGATIVE.    Objective:   Physical Exam  Constitutional: She is oriented to person, place, and time. She appears well-developed and well-nourished. No distress.  HENT:  Head: Normocephalic and atraumatic.  Mouth/Throat: Oropharynx is clear and moist. No oropharyngeal exudate.  Eyes: Pupils are equal, round, and reactive to light. No scleral icterus.  Neck: Normal range of motion. Neck supple.  Cardiovascular: Normal rate, regular rhythm and normal heart sounds.   Pulmonary/Chest: Breath sounds normal.  Abdominal: Soft. Bowel sounds are normal. She exhibits no distension. There is no tenderness.  Musculoskeletal: She exhibits no edema.  Lymphadenopathy:    She has no cervical adenopathy.  Neurological: She is alert and oriented to person, place, and time.  NO FOCAL DEFICITS  Psychiatric:  SLIGHTLY ANXIOUS MOOD, NL AFFECT  Vitals reviewed.     Assessment & Plan:

## 2016-12-14 NOTE — Assessment & Plan Note (Signed)
SYMPTOMS FAIRLY WELL CONTROLLED.  DRINK WATER TO KEEP YOUR URINE LIGHT YELLOW. EAT FIBER FOLLOW UP IN 6 MOS.

## 2016-12-14 NOTE — Assessment & Plan Note (Signed)
SYMPTOMS CONTROLLED/RESOLVED.  CONTINUE TO MONITOR SYMPTOMS. 

## 2016-12-14 NOTE — Assessment & Plan Note (Signed)
SYMPTOMS FAIRLY WELL CONTROLLED ON PROTNIX AND ZANTAC.  CONTINUE TO MONITOR SYMPTOMS. ENCOURAGED PT TO TAKE PROTONIX ON A REGULAR BASIS. SHE DOES NOT DUE TO CONCERNS REGARDING SIDE EFFECTS. FOLLOW UP IN 6 MOS.

## 2016-12-14 NOTE — Assessment & Plan Note (Signed)
MOST LIKELY DUE TO HEMORRHOIDS.  FLEXIBLE SIGMOIDOSCOPY/POSSIBLE HEMORRHOID BANDING APR 13-DISCUSSED PROCEDURE, BENEFITS, & RISKS: < 1% chance of medication reaction, PERFORATION, PELVIC VEIN SEPSIS, OR bleeding. PT DECLINED RX FOR HEMORRHOID CREAM.

## 2016-12-14 NOTE — Assessment & Plan Note (Signed)
SYMPTOMS NOT IDEALLY CONTROLLED.  INCREASE CELEXA TO 20 MG DAILY. CALL WITH QUESTIONS OR CONCERNS. FOLLOW UP IN 6 MOS.

## 2016-12-30 ENCOUNTER — Encounter (HOSPITAL_COMMUNITY)
Admission: RE | Disposition: A | Discharge status: Home / Self Care | Payer: Self-pay | Source: Ambulatory Visit | Attending: Gastroenterology

## 2016-12-30 ENCOUNTER — Ambulatory Visit (HOSPITAL_COMMUNITY)
Admission: RE | Admit: 2016-12-30 | Discharge: 2016-12-30 | Disposition: A | Discharge status: Home / Self Care | Payer: Medicare Other | Source: Ambulatory Visit | Attending: Gastroenterology | Admitting: Gastroenterology

## 2016-12-30 ENCOUNTER — Telehealth: Payer: Self-pay

## 2016-12-30 ENCOUNTER — Encounter (HOSPITAL_COMMUNITY): Payer: Self-pay | Admitting: *Deleted

## 2016-12-30 DIAGNOSIS — Z9049 Acquired absence of other specified parts of digestive tract: Secondary | ICD-10-CM | POA: Diagnosis not present

## 2016-12-30 DIAGNOSIS — K625 Hemorrhage of anus and rectum: Secondary | ICD-10-CM

## 2016-12-30 DIAGNOSIS — R197 Diarrhea, unspecified: Secondary | ICD-10-CM | POA: Insufficient documentation

## 2016-12-30 DIAGNOSIS — Z79899 Other long term (current) drug therapy: Secondary | ICD-10-CM | POA: Insufficient documentation

## 2016-12-30 DIAGNOSIS — Q048 Other specified congenital malformations of brain: Secondary | ICD-10-CM | POA: Diagnosis present

## 2016-12-30 DIAGNOSIS — Z888 Allergy status to other drugs, medicaments and biological substances status: Secondary | ICD-10-CM | POA: Insufficient documentation

## 2016-12-30 DIAGNOSIS — I10 Essential (primary) hypertension: Secondary | ICD-10-CM | POA: Insufficient documentation

## 2016-12-30 DIAGNOSIS — K644 Residual hemorrhoidal skin tags: Secondary | ICD-10-CM | POA: Insufficient documentation

## 2016-12-30 DIAGNOSIS — R12 Heartburn: Secondary | ICD-10-CM | POA: Diagnosis not present

## 2016-12-30 DIAGNOSIS — K5730 Diverticulosis of large intestine without perforation or abscess without bleeding: Secondary | ICD-10-CM | POA: Insufficient documentation

## 2016-12-30 DIAGNOSIS — K648 Other hemorrhoids: Secondary | ICD-10-CM | POA: Insufficient documentation

## 2016-12-30 DIAGNOSIS — K449 Diaphragmatic hernia without obstruction or gangrene: Secondary | ICD-10-CM | POA: Insufficient documentation

## 2016-12-30 HISTORY — PX: FLEXIBLE SIGMOIDOSCOPY: SHX5431

## 2016-12-30 SURGERY — SIGMOIDOSCOPY, FLEXIBLE
Anesthesia: Moderate Sedation

## 2016-12-30 MED ORDER — MEPERIDINE HCL 100 MG/ML IJ SOLN
INTRAMUSCULAR | Status: DC | PRN
  Administered 2016-12-30: 25 mg via INTRAVENOUS

## 2016-12-30 MED ORDER — SODIUM CHLORIDE 0.9 % IV SOLN
INTRAVENOUS | Status: DC
  Administered 2016-12-30: 1000 mL via INTRAVENOUS

## 2016-12-30 MED ORDER — MIDAZOLAM HCL 5 MG/5ML IJ SOLN
INTRAMUSCULAR | Status: DC | PRN
  Administered 2016-12-30: 1 mg via INTRAVENOUS
  Administered 2016-12-30: 2 mg via INTRAVENOUS

## 2016-12-30 MED ORDER — MIDAZOLAM HCL 5 MG/5ML IJ SOLN
INTRAMUSCULAR | Status: AC
  Filled 2016-12-30: qty 10

## 2016-12-30 MED ORDER — STERILE WATER FOR IRRIGATION IR SOLN
Status: DC | PRN
  Administered 2016-12-30: 2.5 mL

## 2016-12-30 MED ORDER — HYDROCORTISONE ACE-PRAMOXINE 1-1 % RE CREA: TOPICAL_CREAM | RECTAL | 0 refills | Status: DC

## 2016-12-30 MED ORDER — MEPERIDINE HCL 100 MG/ML IJ SOLN
INTRAMUSCULAR | Status: AC
  Filled 2016-12-30: qty 2

## 2016-12-30 NOTE — Telephone Encounter (Signed)
PLEASE CALL PHARMACY. OK TO ORDER WITH 2.5% HYDROCORTISONE.

## 2016-12-30 NOTE — Telephone Encounter (Signed)
Pharmacist from [facility] called to ask if Dr. Oneida Alar would let them order this medication with the Hydrocortisone part as 2.5 %.  They do have to order the medication, so please let pharmacist know.  They can be reached at [phone].

## 2016-12-30 NOTE — Telephone Encounter (Signed)
Amy at the pharmacy is aware.

## 2016-12-30 NOTE — Discharge Instructions (Signed)
You HAVE DIVERTICULOSIS IN YOUR LEFT COLON.  YOU HAVE LARGE EXTERNAL HEMORRHOIDS AND SMALL INTERNAL HEMORRHOIDS.   DRINK WATER TO KEEP YOUR URINE LIGHT YELLOW.  AVOID CONSTIPATION OR STRAINING.  USE PREPARATION H FOUR TIMES  A DAY IF NEEDED TO RELIEVE RECTAL PAIN/PRESSURE/BLEEDING OR USE PROCTOCREAM FOUR TIMES DAILY FOR 10 DAYS TO RELIVE RECTAL PAIN/PRESSURE/BLEEDING. DO NOT USE PROCTOCREAM MORE THAN 3 OR 4 TIMES A YEAR TO TREAT HEMORRHOIDS.   PLEASE CALL IF YOU WOULD LIKE TO SEE SURGERY TO FIX YOUR HEMORRHOIDS.   ENDOSCOPY Care After Read the instructions outlined below and refer to this sheet in the next week. These discharge instructions provide you with general information on caring for yourself after you leave the hospital. While your treatment has been planned according to the most current medical practices available, unavoidable complications occasionally occur. If you have any problems or questions after discharge, call DR. Kylene Zamarron, [phone].  ACTIVITY  You may resume your regular activity, but move at a slower pace for the next 24 hours.   Take frequent rest periods for the next 24 hours.   Walking will help get rid of the air and reduce the bloated feeling in your belly (abdomen).   No driving for 24 hours (because of the medicine (anesthesia) used during the test).   You may shower.   Do not sign any important legal documents or operate any machinery for 24 hours (because of the anesthesia used during the test).    NUTRITION  Drink plenty of fluids.   You may resume your normal diet as instructed by your doctor.   Begin with a light meal and progress to your normal diet. Heavy or fried foods are harder to digest and may make you feel sick to your stomach (nauseated).   Avoid alcoholic beverages for 24 hours or as instructed.    MEDICATIONS  You may resume your normal medications.   WHAT YOU CAN EXPECT TODAY  Some feelings of bloating in the abdomen.    Passage of more gas than usual.   Spotting of blood in your stool or on the toilet paper  .  IF YOU HADBIOPSIES TAKEN  DURING THE SIGMOIDOSCOPY/UPPER ENDOSCOPY:  Eat a soft diet IF YOU HAVE NAUSEA, BLOATING, ABDOMINAL PAIN, OR VOMITING.    FINDING OUT THE RESULTS OF YOUR TEST Not all test results are available during your visit. DR. Oneida Alar WILL CALL YOU WITHIN 7 DAYS OF YOUR PROCEDUE WITH YOUR RESULTS. Do not assume everything is normal if you have not heard from DR. Monasia Lair IN ONE WEEK, CALL HER OFFICE AT [phone].  SEEK IMMEDIATE MEDICAL ATTENTION AND CALL THE OFFICE: [phone] IF:  You have more than a spotting of blood in your stool.   Your belly is swollen (abdominal distention).   You are nauseated or vomiting.   You have a temperature over 101F.   You have abdominal pain or discomfort that is severe or gets worse throughout the day.   Hemorrhoids Hemorrhoids are dilated (enlarged) veins around the rectum. Sometimes clots will form in the veins. This makes them swollen and painful. These are called thrombosed hemorrhoids. Causes of hemorrhoids include:  Constipation.   Straining to have a bowel movement.   HEAVY LIFTING  HOME CARE INSTRUCTIONS  Eat a well balanced diet and drink 6 to 8 glasses of water every day to avoid constipation. You may also use a bulk laxative.   Avoid straining to have bowel movements.   Keep anal area dry and  clean.   Do not use a donut shaped pillow or sit on the toilet for long periods. This increases blood pooling and pain.   Move your bowels when your body has the urge; this will require less straining and will decrease pain and pressure.

## 2016-12-30 NOTE — Op Note (Signed)
[facility] Patient Name: Anna Gibson Procedure Date: 12/30/2016 7:02 AM MRN: [id number] Date of Birth: 09/19/1946 Attending MD: Barney Drain , MD CSN: [id number] Age: 71 Admit Type: Outpatient Procedure:                Flexible Sigmoidoscopy, DIAGNOSTIC Indications:              Hematochezia, Anal pain Providers:                Barney Drain, MD, Lurline Del, RN, Charlyne Petrin                            RN, RN Referring MD:             Halford Chessman Medicines:                Meperidine 25 mg IV, Midazolam 3 mg IV Complications:            No immediate complications. Estimated Blood Loss:     Estimated blood loss: none. Procedure:                Pre-Anesthesia Assessment:                           - Prior to the procedure, a History and Physical                            was performed, and patient medications and                            allergies were reviewed. The patient's tolerance of                            previous anesthesia was also reviewed. The risks                            and benefits of the procedure and the sedation                            options and risks were discussed with the patient.                            All questions were answered, and informed consent                            was obtained. Prior Anticoagulants: The patient has                            taken no previous anticoagulant or antiplatelet                            agents. ASA Grade Assessment: II - A patient with                            mild systemic disease. After reviewing the risks  and benefits, the patient was deemed in                            satisfactory condition to undergo the procedure.                            After obtaining informed consent, the scope was                            passed under direct vision. The Colonoscope was                            introduced through the anus and advanced to the the           sigmoid colon. The Flexible sigmoidoscope was                            introduced through the and advanced to the. The                            flexible sigmoidoscopy was technically difficult                            and complex due to restricted mobility of the                            colon. The patient tolerated the procedure fairly                            well. Scope In: 8:52:46 AM Scope Out: 8:58:31 AM Total Procedure Duration: 0 hours 5 minutes 45 seconds  Findings:      The digital rectal exam findings include non-thrombosed external       hemorrhoids.      Multiple small-mouthed diverticula were found in the recto-sigmoid colon       and sigmoid colon.      The recto-sigmoid colon was moderately redundant.      Internal hemorrhoids were found during retroflexion. The hemorrhoids       were small. Impression:               - Non-thrombosed external hemorrhoids found on                            digital rectal exam.                           - Diverticulosis in the recto-sigmoid colon and in                            the sigmoid colon.                           - Redundant RECTOSIGMOID colon.                           - Internal hemorrhoids. Moderate Sedation:      Moderate (  conscious) sedation was administered by the endoscopy nurse       and supervised by the endoscopist. The following parameters were       monitored: oxygen saturation, heart rate, blood pressure, and response       to care. Total physician intraservice time was 16 minutes. Recommendation:           - Continue present medications.                           - High fiber diet.                           - Refer to a surgeon.                           - Patient has a contact number available for                            emergencies. The signs and symptoms of potential                            delayed complications were discussed with the                            patient. Return to  normal activities tomorrow.                            Written discharge instructions were provided to the                            patient. Procedure Code(s):        --- Professional ---                           [phone], Sigmoidoscopy, flexible; diagnostic,                            including collection of specimen(s) by brushing or                            washing, when performed (separate procedure)                           99152, Moderate sedation services provided by the                            same physician or other qualified health care                            professional performing the diagnostic or                            therapeutic service that the sedation supports,                            requiring the presence of an independent trained  observer to assist in the monitoring of the                            patient's level of consciousness and physiological                            status; initial 15 minutes of intraservice time,                            patient age 21 years or older Diagnosis Code(s):        --- Professional ---                           K64.4, Residual hemorrhoidal skin tags                           K64.8, Other hemorrhoids                           K92.1, Melena (includes Hematochezia)                           K62.89, Other specified diseases of anus and rectum                           K57.30, Diverticulosis of large intestine without                            perforation or abscess without bleeding                           Q43.8, Other specified congenital malformations of                            intestine CPT copyright 2016 American Medical Association. All rights reserved. The codes documented in this report are preliminary and upon coder review may  be revised to meet current compliance requirements. Barney Drain, MD Barney Drain, MD 12/30/2016 9:21:19 AM This report has been signed  electronically. Number of Addenda: 0

## 2016-12-30 NOTE — H&P (View-Only) (Signed)
Subjective:    Patient ID: Anna Gibson, female    DOB: 13-May-1946, 71 y.o.   MRN: [id number]  Purvis Kilts, MD  HPI Has good days and bad days. AMY TAKE PROTONIX FOR 3 WEEKS AND THEN QUIT. STARTS TAKING IT WHEN HER STOMACH GETS BAD. IF IT ISN'T THEN SHE WILL TAKE ZANTAC. BAD EPISODE: 1X/MO. EPISODES: HEARTBURN, QUICK DIARRHEA, BUBBLY AND GETS SEVERE ABDOMINAL CRAMPS.  MAY ALSO SEE MUCOUS IN HER STOOL. FEELS PRESSURE AND MAY CAUSE RECTAL URGENCY. MAY HAVE RECTAL PAIN AND SOILING. Burning mouth is better but feels like she could tolerate a higher dose of CELEXA. STILL FEELS BURNING AT THE END OF HER TONGUE.  BACK STILL BOTHERING HER AND JUST HAD AN INJECTION. HAVING HEMORRHOID TROUBLE AND STAYED WITH HER SINCE HER SON WAS BORN. EVERY TIME SHE GOES TO BATHROOM IT BLEEDS. FEELS IT DROPS DOWN AND STAYS PUSHES IT BACK UP IN BUT IT FALLS BACK OUT NOW. USED TO WORK. BOWELS MOVING GOOD.   PT DENIES FEVER, CHILLS, HEMATEMESIS, nausea, vomiting, melena,  CHEST PAIN, SHORTNESS OF BREATH, CHANGE IN BOWEL IN HABITS, constipation, OR problems swallowing.  Past Medical History:  Diagnosis Date  . Burning sensation of mouth 08/13/2014  . HTN (hypertension), though recently low BP 09/29/2011  . Hypertension    Past Surgical History:  Procedure Laterality Date  . CESAREAN SECTION    . CESAREAN SECTION    . CHOLECYSTECTOMY    . COLONOSCOPY  08/14/2007   Rare sigmoid colon diverticula/ A 2 cm hiatal hernia.  Multiple sessile gastric polyp / Screening colonoscopy in 10 years   . ESOPHAGOGASTRODUODENOSCOPY  08/14/2007    Normal duodenum/Gastritis is the likely cause for her abdominal pain  . LEFT HEART CATHETERIZATION WITH CORONARY ANGIOGRAM N/A 09/29/2011   Procedure: LEFT HEART CATHETERIZATION WITH CORONARY ANGIOGRAM;  Surgeon: Lorretta Harp, MD;  Location: [facility] CATH LAB;  Service: Cardiovascular;  Laterality: N/A;   Allergies  Allergen Reactions  . Indomethacin Other (See Comments)    "sever  headache"   Current Outpatient Prescriptions  Medication Sig Dispense Refill  . citalopram (CELEXA) 10 MG tablet Take 1 tablet (10 mg total) by mouth daily.    . magnesium oxide (MAG-OX) 400 MG tablet Take 400 mg by mouth daily.    . pantoprazole (PROTONIX) 40 MG tablet Take 1 tablet (40 mg total) by mouth daily.    . Probiotic Product (PROBIOTIC DAILY PO) Take 1 tablet by mouth daily.    . psyllium (METAMUCIL) 58.6 % powder Take 1 packet by mouth daily. SUGAR FREE-ORANGE FLAVORED    . ranitidine (ZANTAC) 150 MG tablet Take 1 tablet (150 mg total) by mouth 2 (two) times daily. AS NEEDED. YOU SHOULD ALTERNATE WITH PRILOSEC.    .       Review of Systems PER HPI OTHERWISE ALL SYSTEMS ARE NEGATIVE.    Objective:   Physical Exam  Constitutional: She is oriented to person, place, and time. She appears well-developed and well-nourished. No distress.  HENT:  Head: Normocephalic and atraumatic.  Mouth/Throat: Oropharynx is clear and moist. No oropharyngeal exudate.  Eyes: Pupils are equal, round, and reactive to light. No scleral icterus.  Neck: Normal range of motion. Neck supple.  Cardiovascular: Normal rate, regular rhythm and normal heart sounds.   Pulmonary/Chest: Breath sounds normal.  Abdominal: Soft. Bowel sounds are normal. She exhibits no distension. There is no tenderness.  Musculoskeletal: She exhibits no edema.  Lymphadenopathy:    She has no cervical adenopathy.  Neurological: She is alert and oriented to person, place, and time.  NO FOCAL DEFICITS  Psychiatric:  SLIGHTLY ANXIOUS MOOD, NL AFFECT  Vitals reviewed.     Assessment & Plan:

## 2016-12-30 NOTE — Interval H&P Note (Signed)
History and Physical Interval Note:  12/30/2016 8:40 AM  Anna Gibson  has presented today for surgery, with the diagnosis of RECTAL BLEEDING  The various methods of treatment have been discussed with the patient and family. After consideration of risks, benefits and other options for treatment, the patient has consented to  Procedure(s) with comments: FLEXIBLE SIGMOIDOSCOPY (N/A) - [place] (N/A) as a surgical intervention .  The patient's history has been reviewed, patient examined, no change in status, stable for surgery.  I have reviewed the patient's chart and labs.  Questions were answered to the patient's satisfaction.     [employer]

## 2017-01-03 MED ORDER — HYDROCORTISONE 2.5 % RE CREA: 1.0000 "application " | TOPICAL_CREAM | Freq: Four times a day (QID) | RECTAL | 0 refills | Status: DC

## 2017-01-03 NOTE — Telephone Encounter (Signed)
Tried to do a PA for proctocream. pts insurance has denied it, they said " your medication belongs to a class of drugs called Less Than Effective DESI drugs. DESI drugs are excluded from coverage under Medicare rules" it says the physician can contact [employer] at [phone] or can change the medication. According to their online formulary, they will cover: procto-med HC cream, procto-pak cream, proctosol HC cream and proctozone-HC cream.

## 2017-01-03 NOTE — Telephone Encounter (Signed)
PLEASE CALL PT. RX SENT FOR PROCTOZONE.

## 2017-01-04 NOTE — Telephone Encounter (Signed)
Patient made aware.

## 2017-01-05 ENCOUNTER — Encounter (HOSPITAL_COMMUNITY): Payer: Self-pay | Admitting: Gastroenterology

## 2017-01-09 DIAGNOSIS — M4316 Spondylolisthesis, lumbar region: Secondary | ICD-10-CM | POA: Diagnosis not present

## 2017-01-09 DIAGNOSIS — R030 Elevated blood-pressure reading, without diagnosis of hypertension: Secondary | ICD-10-CM | POA: Diagnosis not present

## 2017-01-25 DIAGNOSIS — M545 Low back pain: Secondary | ICD-10-CM | POA: Diagnosis not present

## 2017-01-25 DIAGNOSIS — M4316 Spondylolisthesis, lumbar region: Secondary | ICD-10-CM | POA: Diagnosis not present

## 2017-01-25 DIAGNOSIS — M5136 Other intervertebral disc degeneration, lumbar region: Secondary | ICD-10-CM | POA: Diagnosis not present

## 2017-01-25 DIAGNOSIS — M5416 Radiculopathy, lumbar region: Secondary | ICD-10-CM | POA: Diagnosis not present

## 2017-02-15 DIAGNOSIS — E784 Other hyperlipidemia: Secondary | ICD-10-CM | POA: Diagnosis not present

## 2017-02-15 DIAGNOSIS — I10 Essential (primary) hypertension: Secondary | ICD-10-CM | POA: Diagnosis not present

## 2017-02-15 DIAGNOSIS — R946 Abnormal results of thyroid function studies: Secondary | ICD-10-CM | POA: Diagnosis not present

## 2017-02-15 DIAGNOSIS — R7309 Other abnormal glucose: Secondary | ICD-10-CM | POA: Diagnosis not present

## 2017-02-15 DIAGNOSIS — E079 Disorder of thyroid, unspecified: Secondary | ICD-10-CM | POA: Diagnosis not present

## 2017-02-15 DIAGNOSIS — E782 Mixed hyperlipidemia: Secondary | ICD-10-CM | POA: Diagnosis not present

## 2017-02-15 DIAGNOSIS — Z1389 Encounter for screening for other disorder: Secondary | ICD-10-CM | POA: Diagnosis not present

## 2017-02-28 ENCOUNTER — Other Ambulatory Visit: Payer: Self-pay

## 2017-02-28 ENCOUNTER — Telehealth: Payer: Self-pay | Admitting: Gastroenterology

## 2017-02-28 DIAGNOSIS — K649 Unspecified hemorrhoids: Secondary | ICD-10-CM

## 2017-02-28 NOTE — Telephone Encounter (Signed)
Referral has been made.

## 2017-02-28 NOTE — Telephone Encounter (Signed)
Per [facility] recommendations on 12/2016 procedure report patient is to be referred to surgeon.

## 2017-03-01 NOTE — Telephone Encounter (Signed)
Kelly at Dr. Arnoldo Morale office called and said she has been unable to reach the pt by phone to inform her of her appt 03/16/17 at 11:30am. I mailed a letter to pt to inform her of the appt. Claiborne Billings is aware.

## 2017-03-16 ENCOUNTER — Ambulatory Visit: Payer: Medicare Other | Admitting: General Surgery

## 2017-03-30 DIAGNOSIS — X32XXXD Exposure to sunlight, subsequent encounter: Secondary | ICD-10-CM | POA: Diagnosis not present

## 2017-03-30 DIAGNOSIS — L570 Actinic keratosis: Secondary | ICD-10-CM | POA: Diagnosis not present

## 2017-03-30 DIAGNOSIS — L821 Other seborrheic keratosis: Secondary | ICD-10-CM | POA: Diagnosis not present

## 2017-05-03 ENCOUNTER — Other Ambulatory Visit: Payer: Self-pay

## 2017-05-05 MED ORDER — PANTOPRAZOLE SODIUM 40 MG PO TBEC: 40.0000 mg | DELAYED_RELEASE_TABLET | Freq: Every day | ORAL | 11 refills | Status: DC

## 2017-07-13 ENCOUNTER — Encounter: Payer: Self-pay | Admitting: Gastroenterology

## 2017-07-13 ENCOUNTER — Ambulatory Visit (INDEPENDENT_AMBULATORY_CARE_PROVIDER_SITE_OTHER): Payer: Medicare Other | Admitting: Gastroenterology

## 2017-07-13 DIAGNOSIS — K580 Irritable bowel syndrome with diarrhea: Secondary | ICD-10-CM

## 2017-07-13 DIAGNOSIS — K625 Hemorrhage of anus and rectum: Secondary | ICD-10-CM | POA: Diagnosis not present

## 2017-07-13 DIAGNOSIS — R1310 Dysphagia, unspecified: Secondary | ICD-10-CM | POA: Diagnosis not present

## 2017-07-13 DIAGNOSIS — K219 Gastro-esophageal reflux disease without esophagitis: Secondary | ICD-10-CM | POA: Diagnosis not present

## 2017-07-13 DIAGNOSIS — R1319 Other dysphagia: Secondary | ICD-10-CM

## 2017-07-13 DIAGNOSIS — K146 Glossodynia: Secondary | ICD-10-CM

## 2017-07-13 MED ORDER — CITALOPRAM HYDROBROMIDE 20 MG PO TABS: 20.0000 mg | ORAL_TABLET | Freq: Every day | ORAL | 3 refills | Status: DC

## 2017-07-13 MED ORDER — PANTOPRAZOLE SODIUM 40 MG PO TBEC: DELAYED_RELEASE_TABLET | ORAL | 3 refills | Status: DC

## 2017-07-13 NOTE — Assessment & Plan Note (Addendum)
SYMPTOMS FAIRLY WELL CONTROLLED.   CONTINUE CELEXA-90 DAY SUPPLY, REFILL x3. USE IMODIUM IF NEEDED TO PREVENT DIARRHEA. FOLLOW UP IN 6 MOS.

## 2017-07-13 NOTE — Assessment & Plan Note (Signed)
SYMPTOMS FAIRLY WELL CONTROLLED.  AVOID REFLUX TRIGGERS.  HANDOUT GIVEN. CONTINUE PROTONIX. TAKE 30 MINUTES PRIOR TO BREAKFAST. USE ZANTAC IF NEEDED TO CONTROL HEARTBURN. FOLLOW UP IN 6 MOS.

## 2017-07-13 NOTE — Assessment & Plan Note (Signed)
SYMPTOMS CONTROLLED/RESOLVED.  CONTINUE TO MONITOR SYMPTOMS. 

## 2017-07-13 NOTE — Progress Notes (Signed)
ON RECALL  °

## 2017-07-13 NOTE — Progress Notes (Signed)
Subjective:    Patient ID: Anna Gibson, female    DOB: March 27, 1946, 71 y.o.   MRN: [id number] Sharilyn Sites, MD   HPI Was taking Zantac but STARTED NOT WORKING NOW TAKING PROTONIX DAILY MOST OF THE TIME. HEARTBURN: BURNING IN CHEST AND EPIGASTRIUM AND FEEL LIKE EVERYTHING IS IN HER THROAT. BOWELS ARE FINE. BMS: 2X/DAY, NL FORMED USUALLY UNLESS SHE MAY HAVE SOFT STOOLS. RARE DIARRHEA: 1-2X/MO. USUALLY HAPPENS AFTER SHE DOESN'T EAT FOR A LONG TIME AND THEN EATS A BIG MEAL. CRAMPING PRIOR TO BM IN THE LOWER ABDOMEN. GETS BETTER AFTER BM. TAKES AN IMODIUM PRIOR TO EATING OUT.  PT DENIES FEVER, CHILLS, HEMATOCHEZIA, HEMATEMESIS, nausea, vomiting, melena, CHEST PAIN, SHORTNESS OF BREATH, CHANGE IN BOWEL IN HABITS, constipation, problems swallowing, OR  heartburn or indigestion.  Past Medical History:  Diagnosis Date  . Burning sensation of mouth 08/13/2014  . HTN (hypertension), though recently low BP 09/29/2011  . Hypertension    Past Surgical History:  Procedure Laterality Date  . CARDIAC CATHETERIZATION    . CESAREAN SECTION    . CESAREAN SECTION    . CHOLECYSTECTOMY    . COLONOSCOPY  08/14/2007   Rare sigmoid colon diverticula/ A 2 cm hiatal hernia.  Multiple sessile gastric polyp / Screening colonoscopy in 10 years   . ESOPHAGOGASTRODUODENOSCOPY  08/14/2007    Normal duodenum/Gastritis is the likely cause for her abdominal pain  . FLEXIBLE SIGMOIDOSCOPY N/A 12/30/2016   Procedure: FLEXIBLE SIGMOIDOSCOPY;  Surgeon: Danie Binder, MD;  Location: [facility];  Service: Endoscopy;  Laterality: N/A;  830   . LEFT HEART CATHETERIZATION WITH CORONARY ANGIOGRAM N/A 09/29/2011   Procedure: LEFT HEART CATHETERIZATION WITH CORONARY ANGIOGRAM;  Surgeon: Lorretta Harp, MD;  Location: [facility] CATH LAB;  Service: Cardiovascular;  Laterality: N/A;   Allergies  Allergen Reactions  . Indomethacin Other (See Comments)    "sever headache"   Current Outpatient Prescriptions  Medication Sig  Dispense Refill  . acetaminophen (TYLENOL) 650 MG CR tablet Take 650-1,300 mg by mouth daily as needed for pain.    . bisoprolol-hydrochlorothiazide (ZIAC) 5-6.25 MG tablet Take 1 tablet by mouth daily.    . Calcium Carbonate-Vitamin D (CALCIUM 600+D PO) Take 1 tablet by mouth daily.     . citalopram (CELEXA) 20 MG tablet Take 1 tablet (20 mg total) by mouth daily.    . fluticasone (FLONASE) 50 MCG/ACT nasal spray Place 1 spray into both nostrils daily as needed for allergies or rhinitis.    . Magnesium 500 MG TABS Take 1 tablet by mouth daily.    . Multiple Vitamins-Minerals (WOMENS 50+ MULTI VITAMIN/MIN PO) Take 1 tablet by mouth daily.    Marland Kitchen OVER THE COUNTER MEDICATION Hemp oil extact by mouth daily    . OVER THE COUNTER MEDICATION Super food fruit drink (supplement) daily    . pantoprazole (PROTONIX) 40 MG tablet Take 1 tablet (40 mg total) by mouth daily.    . Probiotic Product (PROBIOTIC DAILY PO) Take 1 tablet by mouth daily.    . psyllium (METAMUCIL) 58.6 % powder Take 1 packet by mouth daily at 3 pm. SUGAR FREE-ORANGE FLAVORED     .      .      .       Review of Systems PER HPI OTHERWISE ALL SYSTEMS ARE NEGATIVE.    Objective:   Physical Exam  Constitutional: She is oriented to person, place, and time. She appears well-developed and well-nourished. No distress.  HENT:  Head: Normocephalic and atraumatic.  Mouth/Throat: Oropharynx is clear and moist. No oropharyngeal exudate.  Eyes: Pupils are equal, round, and reactive to light. No scleral icterus.  Neck: Normal range of motion. Neck supple.  Cardiovascular: Normal rate, regular rhythm and normal heart sounds.   Pulmonary/Chest: Effort normal and breath sounds normal. No respiratory distress.  Abdominal: Soft. Bowel sounds are normal. She exhibits no distension. There is no tenderness.  Musculoskeletal: She exhibits no edema.  Lymphadenopathy:    She has no cervical adenopathy.  Neurological: She is alert and oriented to  person, place, and time.  Psychiatric: She has a normal mood and affect.  Vitals reviewed.     Assessment & Plan:

## 2017-07-13 NOTE — Patient Instructions (Addendum)
AVOID REFLUX TRIGGERS. SEE INFO BELOW.  CONTINUE PROTONIX. TAKE 30 MINUTES PRIOR TO BREAKFAST.  USE ZANTAC IF NEEDED TO CONTROL HEARTBURN.   CONTINUE CELEXA.  USE IMODIUM IF NEEDED TO PREVENT DIARRHEA.   FOLLOW UP IN 6 MOS.    Lifestyle and home remedies TO CONTROL REFLUX You may eliminate or reduce the frequency of heartburn by making the following lifestyle changes:  . Control your weight. Being overweight is a major risk factor for heartburn and GERD. Excess pounds put pressure on your abdomen, pushing up your stomach and causing acid to back up into your esophagus.   . Eat smaller meals. 4 TO 6 MEALS A DAY. This reduces pressure on the lower esophageal sphincter, helping to prevent the valve from opening and acid from washing back into your esophagus.   Dolphus Jenny your belt. Clothes that fit tightly around your waist put pressure on your abdomen and the lower esophageal sphincter.  . Eliminate heartburn triggers. Everyone has specific triggers. .  Common triggers such as fatty or fried foods, spicy food, tomato sauce, carbonated beverages, alcohol, chocolate, mint, garlic, onion, caffeine and nicotine may make heartburn worse.   Marland Kitchen Avoid stooping or bending. Tying your shoes is OK. Bending over for longer periods to weed your garden isn't, especially soon after eating.   . Don't lie down after a meal. Wait at least three to four hours after eating before going to bed, and don't lie down right after eating.   Alternative medicine . Several home remedies exist for treating GERD, but they provide only temporary relief. They include drinking baking soda (sodium bicarbonate) added to water or drinking other fluids such as baking soda mixed with cream of tartar and water. . Although these liquids create temporary relief by neutralizing, washing away or buffering acids, eventually they aggravate the situation by adding gas and fluid to your stomach, increasing pressure and causing more acid  reflux. Further, adding more sodium to your diet may increase your blood pressure and add stress to your heart, and excessive bicarbonate ingestion can alter the acid-base balance in your body.

## 2017-07-13 NOTE — Assessment & Plan Note (Signed)
SYMPTOMS CONTROLLED/RESOLVED.  CONTINUE CELEXA. CONTINUE TO MONITOR SYMPTOMS. FOLLOW UP IN 6 MOS.

## 2017-07-14 NOTE — Progress Notes (Signed)
CC'ED TO PCP 

## 2017-07-19 DIAGNOSIS — E039 Hypothyroidism, unspecified: Secondary | ICD-10-CM | POA: Diagnosis not present

## 2017-07-24 DIAGNOSIS — H2513 Age-related nuclear cataract, bilateral: Secondary | ICD-10-CM | POA: Diagnosis not present

## 2017-11-14 DIAGNOSIS — Z1389 Encounter for screening for other disorder: Secondary | ICD-10-CM | POA: Diagnosis not present

## 2017-11-14 DIAGNOSIS — R7309 Other abnormal glucose: Secondary | ICD-10-CM | POA: Diagnosis not present

## 2017-11-14 DIAGNOSIS — E7849 Other hyperlipidemia: Secondary | ICD-10-CM | POA: Diagnosis not present

## 2017-11-14 DIAGNOSIS — J309 Allergic rhinitis, unspecified: Secondary | ICD-10-CM | POA: Diagnosis not present

## 2017-11-14 DIAGNOSIS — K219 Gastro-esophageal reflux disease without esophagitis: Secondary | ICD-10-CM | POA: Diagnosis not present

## 2017-11-14 DIAGNOSIS — E782 Mixed hyperlipidemia: Secondary | ICD-10-CM | POA: Diagnosis not present

## 2017-11-14 DIAGNOSIS — R946 Abnormal results of thyroid function studies: Secondary | ICD-10-CM | POA: Diagnosis not present

## 2017-11-15 ENCOUNTER — Encounter: Payer: Self-pay | Admitting: Gastroenterology

## 2018-01-10 DIAGNOSIS — E039 Hypothyroidism, unspecified: Secondary | ICD-10-CM | POA: Diagnosis not present

## 2018-01-10 DIAGNOSIS — Z23 Encounter for immunization: Secondary | ICD-10-CM | POA: Diagnosis not present

## 2018-01-15 DIAGNOSIS — J019 Acute sinusitis, unspecified: Secondary | ICD-10-CM | POA: Diagnosis not present

## 2018-01-15 DIAGNOSIS — Z1389 Encounter for screening for other disorder: Secondary | ICD-10-CM | POA: Diagnosis not present

## 2018-01-15 DIAGNOSIS — J3489 Other specified disorders of nose and nasal sinuses: Secondary | ICD-10-CM | POA: Diagnosis not present

## 2018-01-15 DIAGNOSIS — J302 Other seasonal allergic rhinitis: Secondary | ICD-10-CM | POA: Diagnosis not present

## 2018-01-18 ENCOUNTER — Ambulatory Visit: Payer: Medicare Other | Admitting: Gastroenterology

## 2018-01-18 ENCOUNTER — Encounter: Payer: Self-pay | Admitting: Gastroenterology

## 2018-01-18 DIAGNOSIS — R1319 Other dysphagia: Secondary | ICD-10-CM

## 2018-01-18 DIAGNOSIS — K219 Gastro-esophageal reflux disease without esophagitis: Secondary | ICD-10-CM

## 2018-01-18 DIAGNOSIS — K580 Irritable bowel syndrome with diarrhea: Secondary | ICD-10-CM | POA: Diagnosis not present

## 2018-01-18 DIAGNOSIS — K146 Glossodynia: Secondary | ICD-10-CM

## 2018-01-18 DIAGNOSIS — R1310 Dysphagia, unspecified: Secondary | ICD-10-CM

## 2018-01-18 DIAGNOSIS — K625 Hemorrhage of anus and rectum: Secondary | ICD-10-CM | POA: Diagnosis not present

## 2018-01-18 NOTE — Assessment & Plan Note (Signed)
SYMPTOMS CONTROLLED/RESOLVED.  CONTINUE TO MONITOR SYMPTOMS. 

## 2018-01-18 NOTE — Patient Instructions (Addendum)
DRINK WATER TO KEEP YOUR URINE LIGHT YELLOW.  CONTINUE YOUR WEIGHT LOSS EFFORTS. LOSE 10-15 LBS. CONSIDER A LOW DAIRY/MEAT DIET. SEE HANDOUT. OTHERWISE, FOLLOW A HIGH FIBER DIET. AVOID ITEMS THAT CAUSE BLOATING & GAS.    CHEW ONE TUMS WITH MEALS UP TO THREE TIMES A  DAY TO PREVENT DIARRHEA AFTER EATING.  CONTINUE PROTONIX. TAKE 30 MINUTES PRIOR TO BREAKFAST.   FOLLOW UP IN 6 MOS.

## 2018-01-18 NOTE — Assessment & Plan Note (Signed)
SYMPTOMS FAIRLY WELL CONTROLLED IF SHE TAKE SHE MEDS AND EATS RIGHT.  CONTINUE YOUR WEIGHT LOSS EFFORTS. LOSE 10-15 LBS. DRINK WATER TO KEEP YOUR URINE LIGHT YELLOW. CONSIDER A LOW DAIRY/MEAT DIET.  HANDOUT GIVEN. CONTINUE PROTONIX. TAKE 30 MINUTES PRIOR TO BREAKFAST.  FOLLOW UP IN 6 MOS.

## 2018-01-18 NOTE — Assessment & Plan Note (Signed)
SYMPTOMS FAIRLY WELL CONTROLLED EXCEPT WHEN SHE EATS A TUNA SANDWICH.  CONTINUE TO MONITOR SYMPTOMS. FOLLOW UP IN 6 MOS.

## 2018-01-18 NOTE — Progress Notes (Signed)
Subjective:    Patient ID: Anna Gibson, female    DOB: Feb 03, 1946, 72 y.o.   MRN: [id number]  Sharilyn Sites, MD    HPI STARTED TAKING HEMP OIL FOR ACHES/PAINS. NOTICED AFTER FIRST DOSE. NOW DOESN'T NEED A NAPROXEN ANYMORE. PUT ON THYROID MED 2 MOS AGO. IF SHE DOES WITHOUT PROTONIX THEN HAS SYMPTOMS: DIARRHEA, ABDOMINAL PAIN, BUBBLY. BACK ON PROTONIX AFTER TWO DOSES IT STRAIGHTENS OUT AGAIN.  CAN'T EXERCISE ANYMORE DUE TO PROBLEMS WITH HER FEET. HEARTBURN DEPENDS ON WHAT SHE EATS BUT USUALLY PROTONIX TAKES CARE OF SYMPTOMS. METAMUCIL PREVENTS CONSTIPATION. NOTICES TROUBLE SWALLOWING WHEN SHE EATS TUNA SANDWICH BUT JUST TUNA BUT NOT ANYTHING ELSE. BMS: DAILY.  PT DENIES FEVER, CHILLS, HEMATOCHEZIA, HEMATEMESIS, nausea, vomiting, melena, CHEST PAIN, SHORTNESS OF BREATH, CHANGE IN BOWEL IN HABITS, OR constipation.  Past Medical History:  Diagnosis Date  . Burning sensation of mouth 08/13/2014  . HTN (hypertension), though recently low BP 09/29/2011  . Hypertension     Past Surgical History:  Procedure Laterality Date  . CARDIAC CATHETERIZATION    . CESAREAN SECTION    . CESAREAN SECTION    . CHOLECYSTECTOMY    . COLONOSCOPY  08/14/2007   Rare sigmoid colon diverticula/ A 2 cm hiatal hernia.  Multiple sessile gastric polyp / Screening colonoscopy in 10 years   . ESOPHAGOGASTRODUODENOSCOPY  08/14/2007    Normal duodenum/Gastritis is the likely cause for her abdominal pain  . FLEXIBLE SIGMOIDOSCOPY N/A 12/30/2016   Procedure: FLEXIBLE SIGMOIDOSCOPY;  Surgeon: Danie Binder, MD;  Location: [facility];  Service: Endoscopy;  Laterality: N/A;  830   . LEFT HEART CATHETERIZATION WITH CORONARY ANGIOGRAM N/A 09/29/2011   Procedure: LEFT HEART CATHETERIZATION WITH CORONARY ANGIOGRAM;  Surgeon: Lorretta Harp, MD;  Location: [facility] CATH LAB;  Service: Cardiovascular;  Laterality: N/A;   Allergies  Allergen Reactions  . Indomethacin Other (See Comments)    "sever headache"    Current  Outpatient Medications  Medication Sig    . acetaminophen (TYLENOL) 650 MG CR tablet Take 650-1,300 mg by mouth daily as needed for pain.    . Biotin 2500 MCG CAPS Take 1 capsule by mouth daily.    . bisoprolol-hydrochlorothiazide (ZIAC) 5-6.25 MG tablet Take 1 tablet by mouth daily.    . citalopram (CELEXA) 20 MG tablet Take 1 tablet (20 mg total) by mouth daily. (Patient taking differently: Take 10 mg by mouth daily. )    . levothyroxine (SYNTHROID, LEVOTHROID) 50 MCG tablet Take 50 mcg by mouth daily before breakfast.    . Magnesium 400 MG TABS Take 1 tablet by mouth daily.    . Multiple Vitamins-Minerals (WOMENS 50+ MULTI VITAMIN/MIN PO) Take 1 tablet by mouth daily.    Marland Kitchen OVER THE COUNTER MEDICATION Hemp oil extact by mouth daily FOR ACHES/PAINS    . OVER THE COUNTER MEDICATION Super food fruit drink (supplement) daily    . pantoprazole (PROTONIX) 40 MG tablet 1 PO 30 MINS PRIOR TO YOUR FIRST MEAL    .      Marland Kitchen Probiotic Product (PROBIOTIC DAILY PO) Take 1 tablet by mouth daily.    . psyllium (METAMUCIL) 58.6 % powder Take 1 packet by mouth daily at 3 pm. SUGAR FREE-ORANGE FLAVORED     . ranitidine (ZANTAC) 150 MG tablet Take 1 tablet (150 mg total) by mouth 2 (two) times daily. AS NEEDED. YOU SHOULD ALTERNATE WITH PROTONIX    . Calcium Carbonate-Vitamin D (CALCIUM 600+D PO) Take 1 tablet by mouth  daily.     . fluticasone (FLONASE) 50 MCG/ACT nasal spray Place 1 spray into both nostrils daily as needed for allergies or rhinitis.    . IMODIUM PRN IF HAS TO EAT OUT      Review of Systems PER HPI OTHERWISE ALL SYSTEMS ARE NEGATIVE.    Objective:   Physical Exam  Constitutional: She is oriented to person, place, and time. She appears well-developed and well-nourished. No distress.  HENT:  Head: Normocephalic and atraumatic.  Mouth/Throat: Oropharynx is clear and moist. No oropharyngeal exudate.  Eyes: Pupils are equal, round, and reactive to light. No scleral icterus.  Neck: Normal range  of motion. Neck supple.  Cardiovascular: Normal rate, regular rhythm and normal heart sounds.  Pulmonary/Chest: Effort normal and breath sounds normal. No respiratory distress.  Abdominal: Soft. Bowel sounds are normal. She exhibits no distension. There is no tenderness.  Musculoskeletal: She exhibits no edema.  Lymphadenopathy:    She has no cervical adenopathy.  Neurological: She is alert and oriented to person, place, and time.  Psychiatric: She has a normal mood and affect.  Vitals reviewed.     Assessment & Plan:

## 2018-01-18 NOTE — Assessment & Plan Note (Signed)
SYMPTOMS FAIRLY WELL CONTROLLED WITH IMODIUM AND MAY BE EXACERBATED BY BILE SALT.  DRINK WATER TO KEEP YOUR URINE LIGHT YELLOW. FOLLOW A HIGH FIBER DIET. AVOID ITEMS THAT CAUSE BLOATING & GAS. CONSIDER A LOW DAIRY/MEAT DIET.  HANDOUT GIVEN. CHEW ONE TUMS WITH MEALS UP TO THREE TIMES A  DAY TO PREVENT DIARRHEA AFTER EATING. FOLLOW UP IN 6 MOS.

## 2018-01-18 NOTE — Assessment & Plan Note (Signed)
SYMPTOMS FAIRLY WELL CONTROLLED WITH OCCASIONAL FLARE THAT LASTS 1-2 DAYS.  CONTINUE TO MONITOR SYMPTOMS. FOLLOW UP IN 6 MOS.

## 2018-01-19 NOTE — Progress Notes (Signed)
CC'ED TO PCP 

## 2018-01-22 NOTE — Progress Notes (Signed)
ON RECALL  °

## 2018-03-14 DIAGNOSIS — S61211A Laceration without foreign body of left index finger without damage to nail, initial encounter: Secondary | ICD-10-CM | POA: Diagnosis not present

## 2018-03-21 DIAGNOSIS — M79672 Pain in left foot: Secondary | ICD-10-CM | POA: Diagnosis not present

## 2018-03-21 DIAGNOSIS — M19079 Primary osteoarthritis, unspecified ankle and foot: Secondary | ICD-10-CM | POA: Diagnosis not present

## 2018-03-21 DIAGNOSIS — Z4802 Encounter for removal of sutures: Secondary | ICD-10-CM | POA: Diagnosis not present

## 2018-04-02 DIAGNOSIS — E039 Hypothyroidism, unspecified: Secondary | ICD-10-CM | POA: Diagnosis not present

## 2018-04-02 DIAGNOSIS — Z0001 Encounter for general adult medical examination with abnormal findings: Secondary | ICD-10-CM | POA: Diagnosis not present

## 2018-04-02 DIAGNOSIS — R946 Abnormal results of thyroid function studies: Secondary | ICD-10-CM | POA: Diagnosis not present

## 2018-04-02 DIAGNOSIS — Z1389 Encounter for screening for other disorder: Secondary | ICD-10-CM | POA: Diagnosis not present

## 2018-05-09 ENCOUNTER — Telehealth: Payer: Self-pay

## 2018-05-09 NOTE — Telephone Encounter (Signed)
Rx Refill request received from [facility] for Pantoprazole 40 mg take 1 tab po 30 mins prior to first meal, 90 day supply.

## 2018-05-10 MED ORDER — PANTOPRAZOLE SODIUM 40 MG PO TBEC: DELAYED_RELEASE_TABLET | ORAL | 3 refills | Status: DC

## 2018-05-10 NOTE — Addendum Note (Signed)
Addended by: Annitta Needs on: 05/10/2018 08:18 AM   Modules accepted: Orders

## 2018-05-10 NOTE — Telephone Encounter (Signed)
Done

## 2018-06-14 DIAGNOSIS — Z23 Encounter for immunization: Secondary | ICD-10-CM | POA: Diagnosis not present

## 2018-06-14 DIAGNOSIS — L309 Dermatitis, unspecified: Secondary | ICD-10-CM | POA: Diagnosis not present

## 2018-06-28 ENCOUNTER — Encounter: Payer: Self-pay | Admitting: Gastroenterology

## 2018-07-02 ENCOUNTER — Telehealth: Payer: Self-pay

## 2018-07-02 NOTE — Telephone Encounter (Signed)
REVIEWED-NO ADDITIONAL RECOMMENDATIONS. 

## 2018-07-02 NOTE — Telephone Encounter (Signed)
Pt's husband left VM and I returned his call in reference to wife being sick.  [place] at the beach on [place] when they went out to eat late about 10 or 11 pm. She started vomiting and couldn't keep anything down. She had reflux and indigestion really bad. They came on home and she has still been unable to eat anything and can't keep anything down, but just a few sips of water. She was hurting some in epigastric area and said she had been told that she has a hernia and she feels that might be contributing to her problems. I told him to take her to ED since she has not been able to keep anything down but a few sips of water. He said he will take to [facility]. Sending FYI to Roseanne Kaufman, NP in Dr. Oneida Alar absence today.

## 2018-07-02 NOTE — Telephone Encounter (Signed)
Agree 

## 2018-07-03 NOTE — Telephone Encounter (Signed)
REVIEWED. AGREE. NO ADDITIONAL RECOMMENDATIONS. 

## 2018-07-09 ENCOUNTER — Ambulatory Visit: Payer: Medicare Other | Admitting: Gastroenterology

## 2018-07-09 ENCOUNTER — Encounter: Payer: Self-pay | Admitting: Gastroenterology

## 2018-07-09 VITALS — BP 138/90 | HR 67 | Temp 97.4°F | Ht 64.0 in | Wt 175.0 lb

## 2018-07-09 DIAGNOSIS — K219 Gastro-esophageal reflux disease without esophagitis: Secondary | ICD-10-CM

## 2018-07-09 MED ORDER — DEXLANSOPRAZOLE 60 MG PO CPDR: 60.0000 mg | DELAYED_RELEASE_CAPSULE | Freq: Every day | ORAL | 3 refills | Status: DC

## 2018-07-09 NOTE — Progress Notes (Signed)
Referring Provider: Sharilyn Sites, MD Primary Care Physician:  Sharilyn Sites, MD  Chief Complaint  Patient presents with  . Gastroesophageal Reflux    f/u. "extreme burning" with very little food  . Abdominal Pain    "feels like it is under ribs", upper abd  . Nausea    vomiting    HPI:   Anna Gibson is a 72 y.o. female presenting today with a history of GERD and IBS, last seen in May 2019.    Went on vacation Oct 9th, GERD worsening. Day she got sick, had a very small cheeseburger and handful of tater tots. Had a lump in epigastric region starting the 11th. Started throwing up yellow fluid, multiple episodes. Came home several days later. Vomiting finally stopped, felt better, now trying to be careful with what eating, small amounts. Felt even better yesterday but around bedtime didn't feel well, felt nauseated on stomach and not feeling well but no vomiting. When symptoms at its worst, had pain in upper abdomen, under rib cage. No diarrhea associated. +chills, no fever. Discomfort has improved. Some mild tenderness but nothing like it was at first. Quit taking Zantac about a week prior to this starting. Some days Protonix wearing off around bedtime. Has been on Prilosec, Nexium.   Feels dizzy all the time. Present since feeling sick. Urine clear, yellow. Tries to avoid NSAIDs. Most of the time will take tylenol arthritis strength.   For most part, GERD stays under control. Still has some burning intermittently.     Past Medical History:  Diagnosis Date  . Burning sensation of mouth 08/13/2014  . HTN (hypertension), though recently low BP 09/29/2011  . Hypertension     Past Surgical History:  Procedure Laterality Date  . CARDIAC CATHETERIZATION    . CESAREAN SECTION    . CESAREAN SECTION    . CHOLECYSTECTOMY    . COLONOSCOPY  08/14/2007   Rare sigmoid colon diverticula/ A 2 cm hiatal hernia.  Multiple sessile gastric polyp / Screening colonoscopy in 10 years   .  ESOPHAGOGASTRODUODENOSCOPY  08/14/2007    Normal duodenum/Gastritis is the likely cause for her abdominal pain  . FLEXIBLE SIGMOIDOSCOPY N/A 12/30/2016   non-thrombosed external hemorrhoids, redundant rectosigmoid colon, internal hemorrhoids  . LEFT HEART CATHETERIZATION WITH CORONARY ANGIOGRAM N/A 09/29/2011   Procedure: LEFT HEART CATHETERIZATION WITH CORONARY ANGIOGRAM;  Surgeon: Lorretta Harp, MD;  Location: [facility] CATH LAB;  Service: Cardiovascular;  Laterality: N/A;    Current Outpatient Medications  Medication Sig Dispense Refill  . acetaminophen (TYLENOL) 650 MG CR tablet Take 650-1,300 mg by mouth daily as needed for pain.    . Biotin 2500 MCG CAPS Take 1 capsule by mouth daily.    . bisoprolol-hydrochlorothiazide (ZIAC) 5-6.25 MG tablet Take 1 tablet by mouth daily.    . Calcium Carbonate-Vitamin D (CALCIUM 600+D PO) Take 1 tablet by mouth daily.     Marland Kitchen levothyroxine (SYNTHROID, LEVOTHROID) 50 MCG tablet Take 50 mcg by mouth daily before breakfast.    . Magnesium 400 MG TABS Take 1 tablet by mouth daily.    . Multiple Vitamins-Minerals (WOMENS 50+ MULTI VITAMIN/MIN PO) Take 1 tablet by mouth daily.    . pantoprazole (PROTONIX) 40 MG tablet 1 PO 30 MINS PRIOR TO YOUR FIRST MEAL 90 tablet 3  . Probiotic Product (PROBIOTIC DAILY PO) Take 1 tablet by mouth daily.    . psyllium (METAMUCIL) 58.6 % powder Take 1 packet by mouth daily at 3 pm. SUGAR  FREE-ORANGE FLAVORED     . dexlansoprazole (DEXILANT) 60 MG capsule Take 1 capsule (60 mg total) by mouth daily. 90 capsule 3   No current facility-administered medications for this visit.     Allergies as of 07/09/2018 - Review Complete 07/09/2018  Allergen Reaction Noted  . Indomethacin Other (See Comments) 09/29/2011    Family History  Problem Relation Age of Onset  . Congenital heart disease Mother   . Coronary artery disease Brother     Social History   Socioeconomic History  . Marital status: Married    Spouse name: Not on file   . Number of children: Not on file  . Years of education: Not on file  . Highest education level: Not on file  Occupational History  . Not on file  Social Needs  . Financial resource strain: Not on file  . Food insecurity:    Worry: Not on file    Inability: Not on file  . Transportation needs:    Medical: Not on file    Non-medical: Not on file  Tobacco Use  . Smoking status: Never Smoker  . Smokeless tobacco: Never Used  Substance and Sexual Activity  . Alcohol use: No  . Drug use: No  . Sexual activity: Yes  Lifestyle  . Physical activity:    Days per week: Not on file    Minutes per session: Not on file  . Stress: Not on file  Relationships  . Social connections:    Talks on phone: Not on file    Gets together: Not on file    Attends religious service: Not on file    Active member of club or organization: Not on file    Attends meetings of clubs or organizations: Not on file    Relationship status: Not on file  Other Topics Concern  . Not on file  Social History Narrative  . Not on file    Review of Systems: As mentioned in HPI   Physical Exam: BP 138/90   Pulse 67   Temp (!) 97.4 F (36.3 C) (Oral)   Ht 5\' 4"  (1.626 m)   Wt 175 lb (79.4 kg)   BMI 30.04 kg/m  General:   Alert and oriented. No distress noted. Pleasant and cooperative.  Head:  Normocephalic and atraumatic. Eyes:  Conjuctiva clear without scleral icterus. Mouth:  Oral mucosa pink and moist.  Abdomen:  +BS, soft, mild TTP Upper abdomen and non-distended. No rebound or guarding. No HSM or masses noted. Msk:  Symmetrical without gross deformities. Normal posture. Extremities:  Without edema. Neurologic:  Alert and  oriented x4 Psych:  Alert and cooperative. Normal mood and affect.

## 2018-07-09 NOTE — Patient Instructions (Addendum)
Let's stop Protonix for now. Start the Dexilant samples once each morning. Give this about 7 days. If you notice improvement, you can pick up the prescription at your pharmacy.  If you have any recurrent symptoms or worsening, please call me.   We will see you in 3-4 months!  It was a pleasure to see you today. I strive to create trusting relationships with patients to provide genuine, compassionate, and quality care. I value your feedback. If you receive a survey regarding your visit,  I greatly appreciate you taking time to fill this out.   Annitta Needs, PhD, ANP-BC [facility]    Food Choices for Gastroesophageal Reflux Disease, Adult When you have gastroesophageal reflux disease (GERD), the foods you eat and your eating habits are very important. Choosing the right foods can help ease the discomfort of GERD. Consider working with a diet and nutrition specialist (dietitian) to help you make healthy food choices. What general guidelines should I follow? Eating plan  Choose healthy foods low in fat, such as fruits, vegetables, whole grains, low-fat dairy products, and lean meat, fish, and poultry.  Eat frequent, small meals instead of three large meals each day. Eat your meals slowly, in a relaxed setting. Avoid bending over or lying down until 2-3 hours after eating.  Limit high-fat foods such as fatty meats or fried foods.  Limit your intake of oils, butter, and shortening to less than 8 teaspoons each day.  Avoid the following: ? Foods that cause symptoms. These may be different for different people. Keep a food diary to keep track of foods that cause symptoms. ? Alcohol. ? Drinking large amounts of liquid with meals. ? Eating meals during the 2-3 hours before bed.  Cook foods using methods other than frying. This may include baking, grilling, or broiling. Lifestyle   Maintain a healthy weight. Ask your health care provider what weight is healthy for you. If  you need to lose weight, work with your health care provider to do so safely.  Exercise for at least 30 minutes on 5 or more days each week, or as told by your health care provider.  Avoid wearing clothes that fit tightly around your waist and chest.  Do not use any products that contain nicotine or tobacco, such as cigarettes and e-cigarettes. If you need help quitting, ask your health care provider.  Sleep with the head of your bed raised. Use a wedge under the mattress or blocks under the bed frame to raise the head of the bed. What foods are not recommended? The items listed may not be a complete list. Talk with your dietitian about what dietary choices are best for you. Grains Pastries or quick breads with added fat. [place] toast. Vegetables Deep fried vegetables. [place] fries. Any vegetables prepared with added fat. Any vegetables that cause symptoms. For some people this may include tomatoes and tomato products, chili peppers, onions and garlic, and horseradish. Fruits Any fruits prepared with added fat. Any fruits that cause symptoms. For some people this may include citrus fruits, such as oranges, grapefruit, pineapple, and lemons. Meats and other protein foods High-fat meats, such as fatty beef or pork, hot dogs, ribs, ham, sausage, salami and bacon. Fried meat or protein, including fried fish and fried chicken. Nuts and nut butters. Dairy Whole milk and chocolate milk. Sour cream. Cream. Ice cream. Cream cheese. Milk shakes. Beverages Coffee and tea, with or without caffeine. Carbonated beverages. Sodas. Energy drinks. Fruit juice made with acidic  fruits (such as orange or grapefruit). Tomato juice. Alcoholic drinks. Fats and oils Butter. Margarine. Shortening. Ghee. Sweets and desserts Chocolate and cocoa. Donuts. Seasoning and other foods Pepper. Peppermint and spearmint. Any condiments, herbs, or seasonings that cause symptoms. For some people, this may include curry, hot  sauce, or vinegar-based salad dressings. Summary  When you have gastroesophageal reflux disease (GERD), food and lifestyle choices are very important to help ease the discomfort of GERD.  Eat frequent, small meals instead of three large meals each day. Eat your meals slowly, in a relaxed setting. Avoid bending over or lying down until 2-3 hours after eating.  Limit high-fat foods such as fatty meat or fried foods. This information is not intended to replace advice given to you by your health care provider. Make sure you discuss any questions you have with your health care provider. Document Released: 09/05/2005 Document Revised: 09/06/2016 Document Reviewed: 09/06/2016 Elsevier Interactive Patient Education  [employer].

## 2018-07-10 ENCOUNTER — Encounter: Payer: Self-pay | Admitting: Gastroenterology

## 2018-07-17 ENCOUNTER — Telehealth: Payer: Self-pay | Admitting: Gastroenterology

## 2018-07-17 NOTE — Telephone Encounter (Signed)
Dexilant 60 mg #15 at front for pickup and pt is aware.

## 2018-07-17 NOTE — Telephone Encounter (Signed)
PATIENT CALLED AND SAID THE DEXILANT SAMPLES HAVE STOPPED THE LUMP IN THE [place]BC[place] FEELING. AND STOPPED THE BURNING, BUT SHE HAS DIARRHEA.  AND SHE DOESN'T THINK SHE CAN AFFORD THE MEDICATION.  CAN SHE HAVE ANOTHER FEW WEEKS OF SAMPLES [phone] TO MAKE SURE SHE WANTS TO PAY FOR A PRESCRIPTION.

## 2018-07-18 ENCOUNTER — Encounter: Payer: Self-pay | Admitting: Gastroenterology

## 2018-07-18 NOTE — Assessment & Plan Note (Addendum)
72 year old female presenting with likely GERD flare after eating cheeseburger/tater tots, no alarm signs, slowly improving. Notably, she had stopped Zantac a week prior to this event and feels that Protonix has lost some efficacy. She has taken both Prilosec and Nexium in the past. Trial of Dexilant samples now. Call if symptoms do not continue to improve. Will call in prescription if Dexilant is helpful. Return in 3-4 months.   Addendum: No improvement with symptoms despite dietary changes, PPI. Last EGD in 2008. Checking CBC, CMP, lipase.  Proceed with upper endoscopy in the near future with Dr. Oneida Alar due to dyspepsia. The risks, benefits, and alternatives have been discussed in detail with patient. They have stated understanding and desire to proceed.  . If any abnormalities in labs that prompt other imaging, will do that. If EGD unrevealing, she will need imaging studies.

## 2018-07-18 NOTE — Progress Notes (Signed)
cc'ed to pcp °

## 2018-07-26 ENCOUNTER — Telehealth: Payer: Self-pay | Admitting: Gastroenterology

## 2018-07-26 ENCOUNTER — Other Ambulatory Visit: Payer: Self-pay | Admitting: *Deleted

## 2018-07-26 DIAGNOSIS — R1013 Epigastric pain: Secondary | ICD-10-CM

## 2018-07-26 NOTE — Telephone Encounter (Signed)
Please had OV with AB on 10/21 and was told if the dexilant didn't help to call us and we would schedule her an EGD. She said the dexilant was not helping and she wanted to go ahead and get scheduled. She can be reached at [phone]

## 2018-07-26 NOTE — Telephone Encounter (Signed)
Spoke with patient and lab orders entered and mailed. She scheduled EGD for 09/21/18 at 2:30pm. Instructions mailed. Orders entered.

## 2018-07-26 NOTE — Telephone Encounter (Signed)
Thanks Mindy. 

## 2018-07-26 NOTE — Telephone Encounter (Signed)
Let's go ahead and check CBC, CMP, lipase. May arrange EGD with Dr. Oneida Alar due to dyspepsia. If any abnormalities in labs that prompt other imaging, will do that. Last EGD in 2008. If unrevealing, she will need imaging studies.

## 2018-09-21 ENCOUNTER — Encounter (HOSPITAL_COMMUNITY)
Admission: RE | Disposition: A | Discharge status: Home / Self Care | Payer: Self-pay | Source: Home / Self Care | Attending: Internal Medicine

## 2018-09-21 ENCOUNTER — Other Ambulatory Visit: Payer: Self-pay

## 2018-09-21 ENCOUNTER — Ambulatory Visit (HOSPITAL_COMMUNITY)
Admission: RE | Admit: 2018-09-21 | Discharge: 2018-09-21 | Disposition: A | Discharge status: Home / Self Care | Payer: Medicare Other | Attending: Internal Medicine | Admitting: Internal Medicine

## 2018-09-21 ENCOUNTER — Encounter (HOSPITAL_COMMUNITY): Payer: Self-pay | Admitting: *Deleted

## 2018-09-21 DIAGNOSIS — Z8719 Personal history of other diseases of the digestive system: Secondary | ICD-10-CM | POA: Insufficient documentation

## 2018-09-21 DIAGNOSIS — Z79899 Other long term (current) drug therapy: Secondary | ICD-10-CM | POA: Diagnosis not present

## 2018-09-21 DIAGNOSIS — K317 Polyp of stomach and duodenum: Secondary | ICD-10-CM | POA: Insufficient documentation

## 2018-09-21 DIAGNOSIS — K209 Esophagitis, unspecified: Secondary | ICD-10-CM

## 2018-09-21 DIAGNOSIS — K210 Gastro-esophageal reflux disease with esophagitis: Secondary | ICD-10-CM | POA: Insufficient documentation

## 2018-09-21 DIAGNOSIS — R1013 Epigastric pain: Secondary | ICD-10-CM | POA: Diagnosis not present

## 2018-09-21 DIAGNOSIS — I10 Essential (primary) hypertension: Secondary | ICD-10-CM | POA: Insufficient documentation

## 2018-09-21 HISTORY — PX: ESOPHAGOGASTRODUODENOSCOPY: SHX5428

## 2018-09-21 HISTORY — PX: BIOPSY: SHX5522

## 2018-09-21 LAB — CBC WITH DIFFERENTIAL/PLATELET
Abs Immature Granulocytes: 0.01 10*3/uL (ref 0.00–0.07)
Basophils Absolute: 0 10*3/uL (ref 0.0–0.1)
Basophils Relative: 1 %
Eosinophils Absolute: 0.1 10*3/uL (ref 0.0–0.5)
Eosinophils Relative: 2 %
HCT: 37.8 % (ref 36.0–46.0)
Hemoglobin: 12.2 g/dL (ref 12.0–15.0)
Immature Granulocytes: 0 %
Lymphocytes Relative: 22 %
Lymphs Abs: 0.8 10*3/uL (ref 0.7–4.0)
MCH: 29.9 pg (ref 26.0–34.0)
MCHC: 32.3 g/dL (ref 30.0–36.0)
MCV: 92.6 fL (ref 80.0–100.0)
Monocytes Absolute: 0.4 10*3/uL (ref 0.1–1.0)
Monocytes Relative: 10 %
NEUTROS PCT: 65 %
Neutro Abs: 2.4 10*3/uL (ref 1.7–7.7)
PLATELETS: 234 10*3/uL (ref 150–400)
RBC: 4.08 MIL/uL (ref 3.87–5.11)
RDW: 12.8 % (ref 11.5–15.5)
WBC: 3.7 10*3/uL — ABNORMAL LOW (ref 4.0–10.5)
nRBC: 0 % (ref 0.0–0.2)

## 2018-09-21 LAB — COMPREHENSIVE METABOLIC PANEL
ALT: 11 U/L (ref 0–44)
AST: 20 U/L (ref 15–41)
Albumin: 3.8 g/dL (ref 3.5–5.0)
Alkaline Phosphatase: 76 U/L (ref 38–126)
Anion gap: 9 (ref 5–15)
BUN: 14 mg/dL (ref 8–23)
CO2: 25 mmol/L (ref 22–32)
Calcium: 9.3 mg/dL (ref 8.9–10.3)
Chloride: 106 mmol/L (ref 98–111)
Creatinine, Ser: 0.75 mg/dL (ref 0.44–1.00)
GFR calc Af Amer: >60 mL/min (ref >60)
GFR calc non Af Amer: >60 mL/min (ref >60)
Glucose, Bld: 99 mg/dL (ref 70–99)
POTASSIUM: 3.8 mmol/L (ref 3.5–5.1)
Sodium: 140 mmol/L (ref 135–145)
Total Bilirubin: 0.7 mg/dL (ref 0.3–1.2)
Total Protein: 6.6 g/dL (ref 6.5–8.1)

## 2018-09-21 LAB — LIPASE, BLOOD: Lipase: 29 U/L (ref 11–51)

## 2018-09-21 SURGERY — EGD (ESOPHAGOGASTRODUODENOSCOPY)
Anesthesia: Moderate Sedation

## 2018-09-21 MED ORDER — ONDANSETRON HCL 4 MG/2ML IJ SOLN
INTRAMUSCULAR | Status: DC | PRN
  Administered 2018-09-21: 4 mg via INTRAVENOUS

## 2018-09-21 MED ORDER — ONDANSETRON HCL 4 MG/2ML IJ SOLN
INTRAMUSCULAR | Status: AC
  Filled 2018-09-21: qty 2

## 2018-09-21 MED ORDER — LIDOCAINE VISCOUS HCL 2 % MT SOLN
OROMUCOSAL | Status: DC | PRN
  Administered 2018-09-21: 1 via OROMUCOSAL

## 2018-09-21 MED ORDER — STERILE WATER FOR IRRIGATION IR SOLN
Status: DC | PRN
  Administered 2018-09-21: 1.5 mL

## 2018-09-21 MED ORDER — MEPERIDINE HCL 50 MG/ML IJ SOLN
INTRAMUSCULAR | Status: AC
  Filled 2018-09-21: qty 1

## 2018-09-21 MED ORDER — LIDOCAINE VISCOUS HCL 2 % MT SOLN
OROMUCOSAL | Status: AC
  Filled 2018-09-21: qty 15

## 2018-09-21 MED ORDER — MIDAZOLAM HCL 5 MG/5ML IJ SOLN
INTRAMUSCULAR | Status: AC
  Filled 2018-09-21: qty 10

## 2018-09-21 MED ORDER — MIDAZOLAM HCL 5 MG/5ML IJ SOLN
INTRAMUSCULAR | Status: DC | PRN
  Administered 2018-09-21: 2 mg via INTRAVENOUS
  Administered 2018-09-21 (×3): 1 mg via INTRAVENOUS

## 2018-09-21 MED ORDER — MEPERIDINE HCL 100 MG/ML IJ SOLN
INTRAMUSCULAR | Status: DC | PRN
  Administered 2018-09-21: 15 mg
  Administered 2018-09-21: 25 mg

## 2018-09-21 MED ORDER — SODIUM CHLORIDE 0.9 % IV SOLN
INTRAVENOUS | Status: DC
  Administered 2018-09-21: 1000 mL via INTRAVENOUS

## 2018-09-21 NOTE — Op Note (Signed)
[facility] Patient Name: Anna Gibson Procedure Date: 09/21/2018 9:24 AM MRN: [id number] Date of Birth: 1946-06-07 Attending MD: Norvel Richards , MD CSN: [id number] Age: 73 Admit Type: Outpatient Procedure:                Upper GI endoscopy Indications:              Dyspepsia Providers:                Norvel Richards, MD, Gerome Sam, RN,                            Aram Candela Referring MD:              Medicines:                Midazolam 5 mg IV, Meperidine 40 mg IV, Ondansetron                            4 mg IV Complications:            No immediate complications. Estimated Blood Loss:     Estimated blood loss was minimal. Procedure:                Pre-Anesthesia Assessment:                           - Prior to the procedure, a History and Physical                            was performed, and patient medications and                            allergies were reviewed. The patient's tolerance of                            previous anesthesia was also reviewed. The risks                            and benefits of the procedure and the sedation                            options and risks were discussed with the patient.                            All questions were answered, and informed consent                            was obtained. Prior Anticoagulants: The patient has                            taken no previous anticoagulant or antiplatelet                            agents. ASA Grade Assessment: II - A patient with  mild systemic disease. After reviewing the risks                            and benefits, the patient was deemed in                            satisfactory condition to undergo the procedure.                           After obtaining informed consent, the endoscope was                            passed under direct vision. Throughout the                            procedure, the patient's blood pressure, pulse, and                            oxygen saturations were monitored continuously. The                            [id number]-H190 ([id number]) scope was introduced through the                            mouth, and advanced to the second part of duodenum.                            The upper GI endoscopy was accomplished without                            difficulty. Scope In: 10:23:55 AM Scope Out: 10:28:29 AM Total Procedure Duration: 0 hours 4 minutes 34 seconds  Findings:      Esophagitis was found. Scattered erosions within 5 mm of the GE       junction. No Barrett's epithelium seen.      A few 2 mm sessile polyps were found in the gastric body. Otherwise,       stomach appeared normal.      The duodenal bulb and second portion of the duodenum were normal. 1 of       the gastric polyps was was removed with a cold biopsy forceps. Resection       and retrieval were complete. Estimated blood loss was minimal. Impression:               -Mild erosive reflux esophagitis.                           - A few gastric polyps. Resected and retrieved.                           - The examination was otherwise normal.                           - Normal duodenal bulb and second portion of the  duodenum. Recent GI symptoms have resolved. She is                            back at baseline. She is taking Protonix 40 mg once                            daily. Moderate Sedation:      Moderate (conscious) sedation was administered by the endoscopy nurse       and supervised by the endoscopist. The following parameters were       monitored: oxygen saturation, heart rate, blood pressure, respiratory       rate, EKG, adequacy of pulmonary ventilation, and response to care.       Total physician intraservice time was 13 minutes. Recommendation:           - Patient has a contact number available for                            emergencies. The signs and symptoms of potential                             delayed complications were discussed with the                            patient. Return to normal activities tomorrow.                            Written discharge instructions were provided to the                            patient.                           - Resume previous diet.                           - Continue present medications. Continue Protonix                            40 mg daily. Follow-up on pathology. Follow-up with                            Dr. Oneida Alar in the office.                           - No repeat upper endoscopy.                           - Return to GI office in 3 months. Procedure Code(s):        --- Professional ---                           [phone], Esophagogastroduodenoscopy, flexible,                            transoral; with biopsy, single or multiple  G0500, Moderate sedation services provided by the                            same physician or other qualified health care                            professional performing a gastrointestinal                            endoscopic service that sedation supports,                            requiring the presence of an independent trained                            observer to assist in the monitoring of the                            patient's level of consciousness and physiological                            status; initial 15 minutes of intra-service time;                            patient age 66 years or older (additional time may                            be reported with [phone], as appropriate) Diagnosis Code(s):        --- Professional ---                           K20.9, Esophagitis, unspecified                           K31.7, Polyp of stomach and duodenum                           R10.13, Epigastric pain CPT copyright 2018 American Medical Association. All rights reserved. The codes documented in this report are preliminary and upon coder review may  be revised to meet  current compliance requirements. [name]. [name], MD Norvel Richards, MD 09/21/2018 10:38:38 AM This report has been signed electronically. Number of Addenda: 0

## 2018-09-21 NOTE — H&P (Signed)
@LOGO @   Primary Care Physician:  Sharilyn Sites, MD Primary Gastroenterologist:  Dr. Oneida Alar  Pre-Procedure History & Physical: HPI:  Anna Gibson is a 73 y.o. female here for here for further evaluation of protracted episode of dyspepsia started acutely back in October.  Diarrhea epigastric pain some nausea and vomiting.  Symptoms eventually resolved.  She got some OTC "gut alive".  Started taking it.  Felt Dexilant worsen diarrhea.  Takes Protonix 40 mg daily at baseline with good control of symptoms.  Currently, she denies any GI symptoms.  Specifically no dysphagia.  Gallbladder is out.  EGD today per plan. Past Medical History:  Diagnosis Date  . Burning sensation of mouth 08/13/2014  . HTN (hypertension), though recently low BP 09/29/2011  . Hypertension     Past Surgical History:  Procedure Laterality Date  . CARDIAC CATHETERIZATION    . CESAREAN SECTION    . CESAREAN SECTION    . CHOLECYSTECTOMY    . COLONOSCOPY  08/14/2007   Rare sigmoid colon diverticula/ A 2 cm hiatal hernia.  Multiple sessile gastric polyp / Screening colonoscopy in 10 years   . ESOPHAGOGASTRODUODENOSCOPY  08/14/2007    Normal duodenum/Gastritis is the likely cause for her abdominal pain  . FLEXIBLE SIGMOIDOSCOPY N/A 12/30/2016   non-thrombosed external hemorrhoids, redundant rectosigmoid colon, internal hemorrhoids  . LEFT HEART CATHETERIZATION WITH CORONARY ANGIOGRAM N/A 09/29/2011   Procedure: LEFT HEART CATHETERIZATION WITH CORONARY ANGIOGRAM;  Surgeon: Lorretta Harp, MD;  Location: [facility] CATH LAB;  Service: Cardiovascular;  Laterality: N/A;    Prior to Admission medications   Medication Sig Start Date End Date Taking? Authorizing Provider  acetaminophen (TYLENOL) 650 MG CR tablet Take 650-1,300 mg by mouth daily as needed for pain.   Yes [provider]  Biotin 2500 MCG CAPS Take 1 capsule by mouth daily.   Yes [provider]  bisoprolol-hydrochlorothiazide (ZIAC) 5-6.25 MG  tablet Take 1 tablet by mouth daily.   Yes [provider]  Calcium Carbonate-Vitamin D (CALCIUM 600+D PO) Take 1 tablet by mouth daily.    Yes [provider]  levothyroxine (SYNTHROID, LEVOTHROID) 50 MCG tablet Take 50 mcg by mouth daily before breakfast.   Yes [provider]  Magnesium 400 MG TABS Take 1 tablet by mouth daily.   Yes [provider]  Multiple Vitamins-Minerals (WOMENS 50+ MULTI VITAMIN/MIN PO) Take 1 tablet by mouth daily.   Yes [provider]  pantoprazole (PROTONIX) 40 MG tablet 1 PO 30 MINS PRIOR TO YOUR FIRST MEAL 05/10/18  Yes Annitta Needs, NP  Probiotic Product (PROBIOTIC DAILY PO) Take 1 tablet by mouth daily.   Yes [provider]  psyllium (METAMUCIL) 58.6 % powder Take 1 packet by mouth daily at 3 pm. SUGAR FREE-ORANGE FLAVORED    Yes [provider]    Allergies as of 07/26/2018 - Review Complete 07/09/2018  Allergen Reaction Noted  . Indomethacin Other (See Comments) 09/29/2011    Family History  Problem Relation Age of Onset  . Congenital heart disease Mother   . Coronary artery disease Brother     Social History   Socioeconomic History  . Marital status: Married    Spouse name: Not on file  . Number of children: Not on file  . Years of education: Not on file  . Highest education level: Not on file  Occupational History  . Not on file  Social Needs  . Financial resource strain: Not on file  . Food insecurity:  Worry: Not on file    Inability: Not on file  . Transportation needs:    Medical: Not on file    Non-medical: Not on file  Tobacco Use  . Smoking status: Never Smoker  . Smokeless tobacco: Never Used  Substance and Sexual Activity  . Alcohol use: No  . Drug use: No  . Sexual activity: Yes  Lifestyle  . Physical activity:    Days per week: Not on file    Minutes per session: Not on file  . Stress: Not on file  Relationships  . Social connections:    Talks on  phone: Not on file    Gets together: Not on file    Attends religious service: Not on file    Active member of club or organization: Not on file    Attends meetings of clubs or organizations: Not on file    Relationship status: Not on file  . Intimate partner violence:    Fear of current or ex partner: Not on file    Emotionally abused: Not on file    Physically abused: Not on file    Forced sexual activity: Not on file  Other Topics Concern  . Not on file  Social History Narrative  . Not on file    Review of Systems: See HPI, otherwise negative ROS  Physical Exam: BP (!) 162/80   Pulse 68   Temp 98.3 F (36.8 C) (Oral)   Resp 11   Ht 5\' 3"  (1.6 m)   Wt 74.8 kg   SpO2 100%   BMI 29.23 kg/m  General:   Alert,  Well-developed, well-nourished, pleasant and cooperative in NAD Neck:  Supple; no masses or thyromegaly. No significant cervical adenopathy. Lungs:  Clear throughout to auscultation.   No wheezes, crackles, or rhonchi. No acute distress. Heart:  Regular rate and rhythm; no murmurs, clicks, rubs,  or gallops. Abdomen: Non-distended, normal bowel sounds.  Soft and nontender without appreciable mass or hepatosplenomegaly.    Impression/Plan:   73 year old lady with a recent dyspepsia/protracted episode of nausea vomiting nonbloody diarrhea.  Now clinically well.  GERD at baseline.  EGD now being done per plan.  The risks, benefits, limitations, alternatives and imponderables have been reviewed with the patient. Potential for esophageal dilation, biopsy, etc. have also been reviewed.  Questions have been answered. All parties agreeable.   Notice: This dictation was prepared with Dragon dictation along with smaller phrase technology. Any transcriptional errors that result from this process are unintentional and may not be corrected upon review.

## 2018-09-21 NOTE — Discharge Instructions (Signed)
Gastroesophageal Reflux Disease, Adult Gastroesophageal reflux (GER) happens when acid from the stomach flows up into the tube that connects the mouth and the stomach (esophagus). Normally, food travels down the esophagus and stays in the stomach to be digested. With GER, food and stomach acid sometimes move back up into the esophagus. You may have a disease called gastroesophageal reflux disease (GERD) if the reflux: Happens often. Causes frequent or very bad symptoms. Causes problems such as damage to the esophagus. When this happens, the esophagus becomes sore and swollen (inflamed). Over time, GERD can make small holes (ulcers) in the lining of the esophagus. What are the causes? This condition is caused by a problem with the muscle between the esophagus and the stomach. When this muscle is weak or not normal, it does not close properly to keep food and acid from coming back up from the stomach. The muscle can be weak because of: Tobacco use. Pregnancy. Having a certain type of hernia (hiatal hernia). Alcohol use. Certain foods and drinks, such as coffee, chocolate, onions, and peppermint. What increases the risk? You are more likely to develop this condition if you: Are overweight. Have a disease that affects your connective tissue. Use NSAID medicines. What are the signs or symptoms? Symptoms of this condition include: Heartburn. Difficult or painful swallowing. The feeling of having a lump in the throat. A bitter taste in the mouth. Bad breath. Having a lot of saliva. Having an upset or bloated stomach. Belching. Chest pain. Different conditions can cause chest pain. Make sure you see your doctor if you have chest pain. Shortness of breath or noisy breathing (wheezing). Ongoing (chronic) cough or a cough at night. Wearing away of the surface of teeth (tooth enamel). Weight loss. How is this treated? Treatment will depend on how bad your symptoms are. Your doctor may  suggest: Changes to your diet. Medicine. Surgery. Follow these instructions at home: Eating and drinking  Follow a diet as told by your doctor. You may need to avoid foods and drinks such as: Coffee and tea (with or without caffeine). Drinks that contain alcohol. Energy drinks and sports drinks. Bubbly (carbonated) drinks or sodas. Chocolate and cocoa. Peppermint and mint flavorings. Garlic and onions. Horseradish. Spicy and acidic foods. These include peppers, chili powder, curry powder, vinegar, hot sauces, and BBQ sauce. Citrus fruit juices and citrus fruits, such as oranges, lemons, and limes. Tomato-based foods. These include red sauce, chili, salsa, and pizza with red sauce. Fried and fatty foods. These include donuts, french fries, potato chips, and high-fat dressings. High-fat meats. These include hot dogs, rib eye steak, sausage, ham, and bacon. High-fat dairy items, such as whole milk, butter, and cream cheese. Eat small meals often. Avoid eating large meals. Avoid drinking large amounts of liquid with your meals. Avoid eating meals during the 2-3 hours before bedtime. Avoid lying down right after you eat. Do not exercise right after you eat. Lifestyle  Do not use any products that contain nicotine or tobacco. These include cigarettes, e-cigarettes, and chewing tobacco. If you need help quitting, ask your doctor. Try to lower your stress. If you need help doing this, ask your doctor. If you are overweight, lose an amount of weight that is healthy for you. Ask your doctor about a safe weight loss goal. General instructions Pay attention to any changes in your symptoms. Take over-the-counter and prescription medicines only as told by your doctor. Do not take aspirin, ibuprofen, or other NSAIDs unless your doctor says it  is okay. Wear loose clothes. Do not wear anything tight around your waist. Raise (elevate) the head of your bed about 6 inches (15 cm). Avoid bending over  if this makes your symptoms worse. Keep all follow-up visits as told by your doctor. This is important. Contact a doctor if: You have new symptoms. You lose weight and you do not know why. You have trouble swallowing or it hurts to swallow. You have wheezing or a cough that keeps happening. Your symptoms do not get better with treatment. You have a hoarse voice. Get help right away if: You have pain in your arms, neck, jaw, teeth, or back. You feel sweaty, dizzy, or light-headed. You have chest pain or shortness of breath. You throw up (vomit) and your throw-up looks like blood or coffee grounds. You pass out (faint). Your poop (stool) is bloody or black. You cannot swallow, drink, or eat. Summary If a person has gastroesophageal reflux disease (GERD), food and stomach acid move back up into the esophagus and cause symptoms or problems such as damage to the esophagus. Treatment will depend on how bad your symptoms are. Follow a diet as told by your doctor. Take all medicines only as told by your doctor. This information is not intended to replace advice given to you by your health care provider. Make sure you discuss any questions you have with your health care provider. Document Released: 02/22/2008 Document Revised: 03/14/2018 Document Reviewed: 03/14/2018 Elsevier Interactive Patient Education  2019 [place]. EGD Discharge instructions Please read the instructions outlined below and refer to this sheet in the next few weeks. These discharge instructions provide you with general information on caring for yourself after you leave the hospital. Your doctor may also give you specific instructions. While your treatment has been planned according to the most current medical practices available, unavoidable complications occasionally occur. If you have any problems or questions after discharge, please call your doctor. ACTIVITY  You may resume your regular activity but move at a slower  pace for the next 24 hours.   Take frequent rest periods for the next 24 hours.   Walking will help expel (get rid of) the air and reduce the bloated feeling in your abdomen.   No driving for 24 hours (because of the anesthesia (medicine) used during the test).   You may shower.   Do not sign any important legal documents or operate any machinery for 24 hours (because of the anesthesia used during the test).  NUTRITION  Drink plenty of fluids.   You may resume your normal diet.   Begin with a light meal and progress to your normal diet.   Avoid alcoholic beverages for 24 hours or as instructed by your caregiver.  MEDICATIONS  You may resume your normal medications unless your caregiver tells you otherwise.  WHAT YOU CAN EXPECT TODAY  You may experience abdominal discomfort such as a feeling of fullness or gas pains.  FOLLOW-UP  Your doctor will discuss the results of your test with you.  SEEK IMMEDIATE MEDICAL ATTENTION IF ANY OF THE FOLLOWING OCCUR:  Excessive nausea (feeling sick to your stomach) and/or vomiting.   Severe abdominal pain and distention (swelling).   Trouble swallowing.   Temperature over 101 F (37.8 C).   Rectal bleeding or vomiting of blood.    GERD information provided  Continue Protonix 40 mg every day  Office visit with Dr. Oneida Alar in 3 months  Further recommendations to follow pending review of lab report

## 2018-09-25 ENCOUNTER — Encounter: Payer: Self-pay | Admitting: Internal Medicine

## 2018-09-27 ENCOUNTER — Encounter (HOSPITAL_COMMUNITY): Payer: Self-pay | Admitting: Internal Medicine

## 2018-10-30 ENCOUNTER — Ambulatory Visit: Payer: Medicare Other | Admitting: Gastroenterology

## 2018-10-30 ENCOUNTER — Encounter: Payer: Self-pay | Admitting: Gastroenterology

## 2018-10-30 VITALS — BP 147/89 | HR 68 | Temp 98.2°F | Ht 63.0 in | Wt 169.2 lb

## 2018-10-30 DIAGNOSIS — K2100 Gastro-esophageal reflux disease with esophagitis, without bleeding: Secondary | ICD-10-CM

## 2018-10-30 NOTE — Progress Notes (Signed)
Referring Provider: Sharilyn Sites, MD Primary Care Physician:  Sharilyn Sites, MD Primary GI: Dr. Oneida Alar   Chief Complaint  Patient presents with  . Diverticulosis    HPI:   Anna Gibson is a 73 y.o. female presenting today with a history of GERD and IBS. Recently had EGD with findings of mild erosive reflux esophagitis, few gastric polyps s/p resection and retrieval, normal duodenum, fundic gland polyp.   She had episodes of N/V/D after I lost saw her and prior to EGD. Had difficulty reaching staff here to update on clinical status. However, she is doing well now and back to baseline. No abdominal pain. She is taking "Gut Alive", which has resolved abdominal burning. Remains on Protonix once daily.   Would like to hold off on routine screening colonoscopy till later this year.    Past Medical History:  Diagnosis Date  . Burning sensation of mouth 08/13/2014  . HTN (hypertension), though recently low BP 09/29/2011  . Hypertension     Past Surgical History:  Procedure Laterality Date  . BIOPSY  09/21/2018   Procedure: BIOPSY;  Surgeon: Daneil Dolin, MD;  Location: [facility];  Service: Endoscopy;;  gastric  . CARDIAC CATHETERIZATION    . CESAREAN SECTION    . CESAREAN SECTION    . CHOLECYSTECTOMY    . COLONOSCOPY  08/14/2007   Rare sigmoid colon diverticula/ A 2 cm hiatal hernia.  Multiple sessile gastric polyp / Screening colonoscopy in 10 years   . ESOPHAGOGASTRODUODENOSCOPY  08/14/2007    Normal duodenum/Gastritis is the likely cause for her abdominal pain  . ESOPHAGOGASTRODUODENOSCOPY N/A 09/21/2018   Dr. Gala Romney: mild erosive reflux esophagitis, few gastric polyps s/p resection and retrieval, normal duodenum, fundic gland polyp  . FLEXIBLE SIGMOIDOSCOPY N/A 12/30/2016   non-thrombosed external hemorrhoids, redundant rectosigmoid colon, internal hemorrhoids  . LEFT HEART CATHETERIZATION WITH CORONARY ANGIOGRAM N/A 09/29/2011   Procedure: LEFT HEART CATHETERIZATION WITH  CORONARY ANGIOGRAM;  Surgeon: Lorretta Harp, MD;  Location: [facility] CATH LAB;  Service: Cardiovascular;  Laterality: N/A;    Current Outpatient Medications  Medication Sig Dispense Refill  . acetaminophen (TYLENOL) 650 MG CR tablet Take 650-1,300 mg by mouth daily as needed for pain.    . Biotin 2500 MCG CAPS Take 1 capsule by mouth daily.    . bisoprolol-hydrochlorothiazide (ZIAC) 5-6.25 MG tablet Take 1 tablet by mouth daily.    . Calcium Carbonate-Vitamin D (CALCIUM 600+D PO) Take 1 tablet by mouth daily.     . Magnesium 400 MG TABS Take 1 tablet by mouth daily.    . Multiple Vitamins-Minerals (WOMENS 50+ MULTI VITAMIN/MIN PO) Take 1 tablet by mouth daily.    Marland Kitchen OVER THE COUNTER MEDICATION Gut Alive 1 capsule twice daily    . pantoprazole (PROTONIX) 40 MG tablet 1 PO 30 MINS PRIOR TO YOUR FIRST MEAL 90 tablet 3  . Probiotic Product (PROBIOTIC DAILY PO) Take 1 tablet by mouth daily.    . psyllium (METAMUCIL) 58.6 % powder Take 1 packet by mouth daily at 3 pm. SUGAR FREE-ORANGE FLAVORED     . levothyroxine (SYNTHROID, LEVOTHROID) 50 MCG tablet Take 50 mcg by mouth daily before breakfast.     No current facility-administered medications for this visit.     Allergies as of 10/30/2018 - Review Complete 10/30/2018  Allergen Reaction Noted  . Indomethacin Other (See Comments) 09/29/2011    Family History  Problem Relation Age of Onset  . Congenital heart disease Mother   .  Coronary artery disease Brother     Social History   Socioeconomic History  . Marital status: Married    Spouse name: Not on file  . Number of children: Not on file  . Years of education: Not on file  . Highest education level: Not on file  Occupational History  . Not on file  Social Needs  . Financial resource strain: Not on file  . Food insecurity:    Worry: Not on file    Inability: Not on file  . Transportation needs:    Medical: Not on file    Non-medical: Not on file  Tobacco Use  . Smoking status:  Never Smoker  . Smokeless tobacco: Never Used  Substance and Sexual Activity  . Alcohol use: No  . Drug use: No  . Sexual activity: Yes  Lifestyle  . Physical activity:    Days per week: Not on file    Minutes per session: Not on file  . Stress: Not on file  Relationships  . Social connections:    Talks on phone: Not on file    Gets together: Not on file    Attends religious service: Not on file    Active member of club or organization: Not on file    Attends meetings of clubs or organizations: Not on file    Relationship status: Not on file  Other Topics Concern  . Not on file  Social History Narrative  . Not on file    Review of Systems: Gen: Denies fever, chills, anorexia. Denies fatigue, weakness, weight loss.  CV: Denies chest pain, palpitations, syncope, peripheral edema, and claudication. Resp: Denies dyspnea at rest, cough, wheezing, coughing up blood, and pleurisy. GI: see HPI Derm: Denies rash, itching, dry skin Psych: Denies depression, anxiety, memory loss, confusion. No homicidal or suicidal ideation.  Heme: Denies bruising, bleeding, and enlarged lymph nodes.  Physical Exam: BP (!) 147/89   Pulse 68   Temp 98.2 F (36.8 C) (Oral)   Ht 5\' 3"  (1.6 m)   Wt 169 lb 3.2 oz (76.7 kg)   BMI 29.97 kg/m  General:   Alert and oriented. No distress noted. Pleasant and cooperative.  Head:  Normocephalic and atraumatic. Eyes:  Conjuctiva clear without scleral icterus. Mouth:  Oral mucosa pink and moist.  Abdomen:  +BS, soft, non-tender and non-distended. No rebound or guarding. No HSM or masses noted. Msk:  Symmetrical without gross deformities. Normal posture. Extremities:  Without edema. Neurologic:  Alert and  oriented x4 Psych:  Alert and cooperative. Normal mood and affect.

## 2018-10-30 NOTE — Patient Instructions (Signed)
I am so glad you are feeling better, and I'm sorry you weren't able to reach me to give updates when you were feeling so bad. Like we talked about, check into MyChart, as you can send a message directly to me if needed.  We will see you in 6 months or sooner if needed!  I enjoyed seeing you again today! As you know, I value our relationship and want to provide genuine, compassionate, and quality care. I welcome your feedback. If you receive a survey regarding your visit,  I greatly appreciate you taking time to fill this out. See you next time!  Annitta Needs, PhD, ANP-BC [facility]

## 2018-10-30 NOTE — Assessment & Plan Note (Signed)
Controlled well with Protonix once daily. Regarding her symptoms of N/V/D after seeing me last and prior to EGD, I suspect she may have had an acute illness followed by post-infectious IBS-type presentation. She is doing well now. Will see her in 6 months and arrange colonoscopy at that time.

## 2018-10-31 NOTE — Progress Notes (Signed)
CC'D TO PCP °

## 2018-11-06 ENCOUNTER — Telehealth: Payer: Self-pay | Admitting: Internal Medicine

## 2018-11-06 MED ORDER — PANTOPRAZOLE SODIUM 40 MG PO TBEC: DELAYED_RELEASE_TABLET | ORAL | 3 refills | Status: DC

## 2018-11-06 NOTE — Telephone Encounter (Signed)
Pt said she was here last week to see AB and AB was going to send in a refill for her Protonix. Pt said that [facility] hasn't received it yet.

## 2018-11-06 NOTE — Telephone Encounter (Signed)
Pt was seen 10/30/18. Please advise on refill.

## 2018-11-06 NOTE — Telephone Encounter (Signed)
Completed.

## 2018-11-08 DIAGNOSIS — D225 Melanocytic nevi of trunk: Secondary | ICD-10-CM | POA: Diagnosis not present

## 2018-11-08 DIAGNOSIS — L720 Epidermal cyst: Secondary | ICD-10-CM | POA: Diagnosis not present

## 2018-11-08 DIAGNOSIS — L821 Other seborrheic keratosis: Secondary | ICD-10-CM | POA: Diagnosis not present

## 2018-12-13 DIAGNOSIS — I10 Essential (primary) hypertension: Secondary | ICD-10-CM | POA: Diagnosis not present

## 2018-12-13 DIAGNOSIS — Z0001 Encounter for general adult medical examination with abnormal findings: Secondary | ICD-10-CM | POA: Diagnosis not present

## 2018-12-13 DIAGNOSIS — E039 Hypothyroidism, unspecified: Secondary | ICD-10-CM | POA: Diagnosis not present

## 2018-12-13 DIAGNOSIS — Z1389 Encounter for screening for other disorder: Secondary | ICD-10-CM | POA: Diagnosis not present

## 2018-12-13 DIAGNOSIS — E7849 Other hyperlipidemia: Secondary | ICD-10-CM | POA: Diagnosis not present

## 2019-04-23 DIAGNOSIS — H524 Presbyopia: Secondary | ICD-10-CM | POA: Diagnosis not present

## 2019-04-29 NOTE — Progress Notes (Signed)
Primary Care Physician:  Sharilyn Sites, MD Primary GI Physician: Dr. Oneida Alar  Chief Complaint  Patient presents with  . Gastroesophageal Reflux    HPI:   Anna Gibson is a 73 y.o. female presenting today for follow-up. She has GI history significant for GERD and IBS. Patient last seen on 10/30/18 for follow-up of GERD symptoms and epigastric discomfort/burning that had worsened at the prior visit. She had an EGD on 09/21/18 which revealed mild erosive reflux esophagitis, benign gastric polyps, otherwise normal.  At follow-up she had started "Gut Alive" was doing well. Recommended continuing Protonix 40 mg daily.   Last TCS 2008. Was due for repeat in 2018.   Today she states things were going great until about 3 weeks ago when her tomato crops came in. Was eating tomato's 3 times a day which caused her to have breakthrough GERD symptoms. She has decreased the amount of tomato's she is eating and is no longer have breakthrough symptoms. No upper or lower abdominal pain. No dysphagia. No N/V. No unintentional weight loss. Appetite is good.  IBS: BMs are doing "fine." BM usually twice a day. Usually soft and formed. Only with loose stools if she eats something she isn't supposed to. Will take imodium and stools return to normal. Will also take imodium if she is going out to eat and planning to have something she knows will cause lose stools. Imodium maybe taking once a week. No hematochezia or melena.   Patient wants to wait on colonoscopy until next visit due to [place].   No NSAIDs.   No fever, chills, fatigue, unintentional weight loss, lightheadedness, dizziness, or feeling like she will pass out.   Past Medical History:  Diagnosis Date  . Burning sensation of mouth 08/13/2014  . GERD (gastroesophageal reflux disease)   . HTN (hypertension), though recently low BP 09/29/2011  . Hypertension     Past Surgical History:  Procedure Laterality Date  . BIOPSY  09/21/2018   Procedure:  BIOPSY;  Surgeon: Daneil Dolin, MD;  Location: [facility];  Service: Endoscopy;;  gastric  . CARDIAC CATHETERIZATION    . CESAREAN SECTION    . CESAREAN SECTION    . CHOLECYSTECTOMY    . COLONOSCOPY  08/14/2007   Rare sigmoid colon diverticula/ A 2 cm hiatal hernia.  Multiple sessile gastric polyp / Screening colonoscopy in 10 years   . ESOPHAGOGASTRODUODENOSCOPY  08/14/2007    Normal duodenum/Gastritis is the likely cause for her abdominal pain  . ESOPHAGOGASTRODUODENOSCOPY N/A 09/21/2018   Dr. Gala Romney: mild erosive reflux esophagitis, few gastric polyps s/p resection and retrieval, normal duodenum, fundic gland polyp  . FLEXIBLE SIGMOIDOSCOPY N/A 12/30/2016   non-thrombosed external hemorrhoids, redundant rectosigmoid colon, internal hemorrhoids  . LEFT HEART CATHETERIZATION WITH CORONARY ANGIOGRAM N/A 09/29/2011   Procedure: LEFT HEART CATHETERIZATION WITH CORONARY ANGIOGRAM;  Surgeon: Lorretta Harp, MD;  Location: [facility] CATH LAB;  Service: Cardiovascular;  Laterality: N/A;    Current Outpatient Medications  Medication Sig Dispense Refill  . acetaminophen (TYLENOL) 650 MG CR tablet Take 650-1,300 mg by mouth daily as needed for pain.    . Biotin 2500 MCG CAPS Take 1 capsule by mouth daily.    . bisoprolol-hydrochlorothiazide (ZIAC) 5-6.25 MG tablet Take 1 tablet by mouth daily.    . Calcium Carbonate-Vitamin D (CALCIUM 600+D PO) Take 1 tablet by mouth daily.     . Magnesium 400 MG TABS Take 1 tablet by mouth daily.    . Multiple  Vitamins-Minerals (WOMENS 50+ MULTI VITAMIN/MIN PO) Take 1 tablet by mouth daily.    Marland Kitchen OVER THE COUNTER MEDICATION Gut Alive 1 capsule twice daily    . pantoprazole (PROTONIX) 40 MG tablet 1 PO 30 MINS PRIOR TO YOUR FIRST MEAL 90 tablet 3  . Probiotic Product (PROBIOTIC DAILY PO) Take 1 tablet by mouth daily.    . psyllium (METAMUCIL) 58.6 % powder Take 1 packet by mouth daily at 3 pm. SUGAR FREE-ORANGE FLAVORED      No current facility-administered  medications for this visit.     Allergies as of 04/30/2019 - Review Complete 04/30/2019  Allergen Reaction Noted  . Indomethacin Other (See Comments) 09/29/2011    Family History  Problem Relation Age of Onset  . Congenital heart disease Mother   . Coronary artery disease Brother     Social History   Socioeconomic History  . Marital status: Married    Spouse name: Not on file  . Number of children: Not on file  . Years of education: Not on file  . Highest education level: Not on file  Occupational History  . Not on file  Social Needs  . Financial resource strain: Not on file  . Food insecurity    Worry: Not on file    Inability: Not on file  . Transportation needs    Medical: Not on file    Non-medical: Not on file  Tobacco Use  . Smoking status: Never Smoker  . Smokeless tobacco: Never Used  Substance and Sexual Activity  . Alcohol use: No  . Drug use: No  . Sexual activity: Yes  Lifestyle  . Physical activity    Days per week: Not on file    Minutes per session: Not on file  . Stress: Not on file  Relationships  . Social Herbalist on phone: Not on file    Gets together: Not on file    Attends religious service: Not on file    Active member of club or organization: Not on file    Attends meetings of clubs or organizations: Not on file    Relationship status: Not on file  Other Topics Concern  . Not on file  Social History Narrative  . Not on file    Review of Systems: Gen: See HPI CV: Denies chest pain, palpitations, peripheral edema Resp: Denies dyspnea at rest, cough, wheezing. GI: See HPI. Derm: Denies rash, itching, dry skin Psych: Denies depression, anxiety Heme: Denies bruising, bleeding  Physical Exam: BP (!) 152/90   Pulse 71   Temp (!) 96.8 F (36 C) (Temporal)   Ht 5\' 3"  (1.6 m)   Wt 172 lb 9.6 oz (78.3 kg)   BMI 30.57 kg/m  General:   Alert and oriented. No distress noted. Pleasant and cooperative.  Head:   Normocephalic and atraumatic. Eyes:  Conjuctiva clear without scleral icterus. Heart:  S1, S2 present without murmurs appreciated. Lungs:  Clear to auscultation bilaterally. No wheezes, rales, or rhonchi. No distress.  Abdomen:  +BS, soft, non-tender and non-distended. No rebound or guarding. No HSM or masses noted. Msk:  Symmetrical without gross deformities. Normal posture. Extremities:  Without edema. Neurologic:  Alert and  oriented x4 Psych: Normal mood and affect.

## 2019-04-30 ENCOUNTER — Ambulatory Visit: Payer: Medicare Other | Admitting: Gastroenterology

## 2019-04-30 ENCOUNTER — Encounter: Payer: Self-pay | Admitting: Gastroenterology

## 2019-04-30 ENCOUNTER — Other Ambulatory Visit: Payer: Self-pay

## 2019-04-30 VITALS — BP 152/90 | HR 71 | Temp 96.8°F | Ht 63.0 in | Wt 172.6 lb

## 2019-04-30 DIAGNOSIS — K219 Gastro-esophageal reflux disease without esophagitis: Secondary | ICD-10-CM | POA: Diagnosis not present

## 2019-04-30 DIAGNOSIS — K580 Irritable bowel syndrome with diarrhea: Secondary | ICD-10-CM | POA: Diagnosis not present

## 2019-04-30 MED ORDER — PANTOPRAZOLE SODIUM 40 MG PO TBEC: DELAYED_RELEASE_TABLET | ORAL | 3 refills | Status: AC

## 2019-04-30 NOTE — Assessment & Plan Note (Addendum)
Chronic. Symptoms are well controlled at this time with occasional imodium use about once a week. Also taking Gut Alive, daily probiotic, and metamucil. BMs usually soft and formed twice daily. Only gets diarrhea if she eats something she knows she isn't supposed to. One imodium will return stools to normal. No abdominal pain. No alarm symptoms. Patient is overdue for screening colonoscopy. TCS in 2008 normal. Due for repeat in 2018. Discussed scheduling TCS with patient, but she again wants to wait at this time due to [place] and discuss at next visit. CBC and CMP in January 2020 essentially normal.   Continue current medications. Imodium as needed.  Follow-up in 6 months. Discuss scheduling TCS at this time.

## 2019-04-30 NOTE — Patient Instructions (Addendum)
I have sent a refill of Protonix to your pharmacy.   We will plan to see you back in 6 months for follow-up. Hopefully we can get your scheduled for colonoscopy at that time.   I am glad you are doing well!   Aliene Altes, PA-C [facility]

## 2019-04-30 NOTE — Assessment & Plan Note (Addendum)
Chronic. Well controlled at this time on Protonix 40 mg daily. She did have breakthrough symptoms when eating a lot of tomatos but has since decreased this and is now without any breakthrough symptoms. No other upper GI symptoms. No alarm symptoms.  Also taking Gut Alive which she reports helped her symptoms as well. Continue Protonix 40 mg 30 minutes before breakfast. Follow-up in 6 months.

## 2019-06-27 DIAGNOSIS — Z23 Encounter for immunization: Secondary | ICD-10-CM | POA: Diagnosis not present

## 2019-06-27 DIAGNOSIS — E7849 Other hyperlipidemia: Secondary | ICD-10-CM | POA: Diagnosis not present

## 2019-06-27 DIAGNOSIS — Z1389 Encounter for screening for other disorder: Secondary | ICD-10-CM | POA: Diagnosis not present

## 2019-06-27 DIAGNOSIS — I10 Essential (primary) hypertension: Secondary | ICD-10-CM | POA: Diagnosis not present

## 2019-08-07 DIAGNOSIS — H18593 Other hereditary corneal dystrophies, bilateral: Secondary | ICD-10-CM | POA: Diagnosis not present

## 2019-08-07 DIAGNOSIS — H04123 Dry eye syndrome of bilateral lacrimal glands: Secondary | ICD-10-CM | POA: Diagnosis not present

## 2019-08-07 DIAGNOSIS — H0288A Meibomian gland dysfunction right eye, upper and lower eyelids: Secondary | ICD-10-CM | POA: Diagnosis not present

## 2019-08-07 DIAGNOSIS — H0288B Meibomian gland dysfunction left eye, upper and lower eyelids: Secondary | ICD-10-CM | POA: Diagnosis not present

## 2019-08-21 DIAGNOSIS — H0288A Meibomian gland dysfunction right eye, upper and lower eyelids: Secondary | ICD-10-CM | POA: Diagnosis not present

## 2019-08-21 DIAGNOSIS — H18593 Other hereditary corneal dystrophies, bilateral: Secondary | ICD-10-CM | POA: Diagnosis not present

## 2019-08-21 DIAGNOSIS — H04123 Dry eye syndrome of bilateral lacrimal glands: Secondary | ICD-10-CM | POA: Diagnosis not present

## 2019-08-21 DIAGNOSIS — H0288B Meibomian gland dysfunction left eye, upper and lower eyelids: Secondary | ICD-10-CM | POA: Diagnosis not present

## 2019-10-20 DIAGNOSIS — E7849 Other hyperlipidemia: Secondary | ICD-10-CM | POA: Diagnosis not present

## 2019-10-20 DIAGNOSIS — E039 Hypothyroidism, unspecified: Secondary | ICD-10-CM | POA: Diagnosis not present

## 2019-10-20 DIAGNOSIS — I10 Essential (primary) hypertension: Secondary | ICD-10-CM | POA: Diagnosis not present

## 2019-10-28 DIAGNOSIS — E7849 Other hyperlipidemia: Secondary | ICD-10-CM | POA: Diagnosis not present

## 2019-10-28 DIAGNOSIS — E039 Hypothyroidism, unspecified: Secondary | ICD-10-CM | POA: Diagnosis not present

## 2019-10-28 DIAGNOSIS — Z0001 Encounter for general adult medical examination with abnormal findings: Secondary | ICD-10-CM | POA: Diagnosis not present

## 2019-10-28 DIAGNOSIS — R7309 Other abnormal glucose: Secondary | ICD-10-CM | POA: Diagnosis not present

## 2019-10-28 DIAGNOSIS — E782 Mixed hyperlipidemia: Secondary | ICD-10-CM | POA: Diagnosis not present

## 2019-10-28 DIAGNOSIS — Z1389 Encounter for screening for other disorder: Secondary | ICD-10-CM | POA: Diagnosis not present

## 2019-10-28 DIAGNOSIS — R946 Abnormal results of thyroid function studies: Secondary | ICD-10-CM | POA: Diagnosis not present

## 2019-10-28 DIAGNOSIS — E559 Vitamin D deficiency, unspecified: Secondary | ICD-10-CM | POA: Diagnosis not present

## 2019-11-03 NOTE — Progress Notes (Signed)
Referring Provider: Sharilyn Sites, MD Primary Care Physician:  Sharilyn Sites, MD Primary GI Physician: Dr. Oneida Alar  Chief Complaint  Patient presents with  . Follow-up    [place],IBS    HPI:   Anna Gibson is a 74 y.o. female presenting today for follow-up. She has GI history significant for GERD and IBS. Last EGD on 09/21/18 which revealed mild erosive reflux esophagitis, benign gastric polyps, otherwise normal. Last TCS 2008. Was due for repeat in 2018.  She was last seen in our office on 04/30/2019.  She did have return of GERD symptoms prior to her office visit due to eating tomatoes frequently.  Once decreasing tomatoes, GERD symptoms resolved.  No other significant upper GI symptoms.  IBS was doing well with two soft, formed BMs daily.  Occasional loose stool if eating something she is not supposed to.  She will take Imodium for this and stools returned to normal.  No alarm symptoms.  She desired to wait to schedule colonoscopy until next visit due to COVID-19.  Plan to follow-up in 6 months and schedule colonoscopy at that time.  Today:   GERD: Taking Protonix as needed. Famotidine daily 6 days a week. May go 2 weeks and not require Protonix. Typically will have breakthrough symptoms only if eating the wrong thing. Tomatoes are the biggest trigger. Also peppers. Occasional soda. Avoiding fried/fatty foods as much as possible. No dysphagia typically. Will occur with peanut butter. Present for 10 years. No worsening. Does not feel this needs to be evaluated or treated. No dysphagia to other foods or liquids. No nausea or vomiting. No abdominal pain. Will take an Aleve for arthritis for back and hip pain about once a month.   IBS: Loves coffee and tea. Coffee will cause diarrhea. Has limited coffee. Usually BMs are formed. 1-2 BMs daily. No unintentional weight loss. No blood in the stool or black stool. Imodium as needed.  Colon cancer screening: Still wants to wait due to COVID-19. She nor  her husband have had the vaccine. States she is trying to have this done in the next couple of weeks. Would consider colonoscopy in the spring or summer.   Past Medical History:  Diagnosis Date  . Burning sensation of mouth 08/13/2014  . GERD (gastroesophageal reflux disease)   . HTN (hypertension), though recently low BP 09/29/2011  . Hypertension   . Vitamin D deficiency     Past Surgical History:  Procedure Laterality Date  . BIOPSY  09/21/2018   Procedure: BIOPSY;  Surgeon: Daneil Dolin, MD;  Location: [facility];  Service: Endoscopy;;  gastric  . CARDIAC CATHETERIZATION    . CESAREAN SECTION    . CESAREAN SECTION    . CHOLECYSTECTOMY    . COLONOSCOPY  08/14/2007   Rare sigmoid colon diverticula.  Random colon biopsies benign.  Repeat colonoscopy in 10 years.  . ESOPHAGOGASTRODUODENOSCOPY  08/14/2007    2 cm hiatal hernia, multiple sessile gastric polyps in the fundus, multiple erosions in the antrum, normal duodenum/Gastritis is the likely cause for her abdominal pain.  No H. pylori or celiac sprue.  . ESOPHAGOGASTRODUODENOSCOPY N/A 09/21/2018   Dr. Gala Romney: mild erosive reflux esophagitis, few gastric polyps s/p resection and retrieval, normal duodenum, fundic gland polyp  . FLEXIBLE SIGMOIDOSCOPY N/A 12/30/2016   non-thrombosed external hemorrhoids, redundant rectosigmoid colon, internal hemorrhoids  . LEFT HEART CATHETERIZATION WITH CORONARY ANGIOGRAM N/A 09/29/2011   Procedure: LEFT HEART CATHETERIZATION WITH CORONARY ANGIOGRAM;  Surgeon: Lorretta Harp,  MD;  Location: [facility] CATH LAB;  Service: Cardiovascular;  Laterality: N/A;    Current Outpatient Medications  Medication Sig Dispense Refill  . acetaminophen (TYLENOL) 650 MG CR tablet Take 650-1,300 mg by mouth daily as needed for pain.    . Biotin 2500 MCG CAPS Take 1 capsule by mouth daily.    . bisoprolol-hydrochlorothiazide (ZIAC) 5-6.25 MG tablet Take 1 tablet by mouth daily.    . Calcium Carbonate-Vitamin D (CALCIUM  600+D PO) Take 1 tablet by mouth daily.     . famotidine (PEPCID) 20 MG tablet Take 20 mg by mouth daily. Takes 6 days out of the week.    . Magnesium 400 MG TABS Take 1 tablet by mouth daily.    . Multiple Vitamins-Minerals (WOMENS 50+ MULTI VITAMIN/MIN PO) Take 1 tablet by mouth daily.    Marland Kitchen OVER THE COUNTER MEDICATION Gut Alive 1 capsule twice daily    . pantoprazole (PROTONIX) 40 MG tablet 1 PO 30 MINS PRIOR TO YOUR FIRST MEAL (Patient taking differently: as needed. 1 PO 30 MINS PRIOR TO YOUR FIRST MEAL) 90 tablet 3  . Probiotic Product (PROBIOTIC DAILY PO) Take 1 tablet by mouth daily.    . psyllium (METAMUCIL) 58.6 % powder Take 1 packet by mouth daily at 3 pm. SUGAR FREE-ORANGE FLAVORED      No current facility-administered medications for this visit.    Allergies as of 11/04/2019 - Review Complete 11/04/2019  Allergen Reaction Noted  . Indomethacin Other (See Comments) 09/29/2011    Family History  Problem Relation Age of Onset  . Congenital heart disease Mother   . Coronary artery disease Brother   . Colon cancer Neg Hx     Social History   Socioeconomic History  . Marital status: Married    Spouse name: Not on file  . Number of children: Not on file  . Years of education: Not on file  . Highest education level: Not on file  Occupational History  . Not on file  Tobacco Use  . Smoking status: Never Smoker  . Smokeless tobacco: Never Used  Substance and Sexual Activity  . Alcohol use: No  . Drug use: No  . Sexual activity: Yes  Other Topics Concern  . Not on file  Social History Narrative  . Not on file   Social Determinants of Health   Financial Resource Strain:   . Difficulty of Paying Living Expenses: Not on file  Food Insecurity:   . Worried About Charity fundraiser in the Last Year: Not on file  . Ran Out of Food in the Last Year: Not on file  Transportation Needs:   . Lack of Transportation (Medical): Not on file  . Lack of Transportation  (Non-Medical): Not on file  Physical Activity:   . Days of Exercise per Week: Not on file  . Minutes of Exercise per Session: Not on file  Stress:   . Feeling of Stress : Not on file  Social Connections:   . Frequency of Communication with Friends and Family: Not on file  . Frequency of Social Gatherings with Friends and Family: Not on file  . Attends Religious Services: Not on file  . Active Member of Clubs or Organizations: Not on file  . Attends Archivist Meetings: Not on file  . Marital Status: Not on file    Review of Systems: Gen: Denies fever, chills, lightheadedness, dizziness, presyncope, or syncope. CV: Denies chest pain. Admits to occasional heart palpitations. Resp:  Denies shortness of breath or cough. GI: See HPI Heme: See HPI  Physical Exam: BP (!) 152/95   Pulse 66   Temp (!) 96.8 F (36 C) (Temporal)   Ht 5\' 3"  (1.6 m)   Wt 172 lb 12.8 oz (78.4 kg)   BMI 30.61 kg/m  General:   Alert and oriented. No distress noted. Pleasant and cooperative.  Head:  Normocephalic and atraumatic. Eyes:  Conjuctiva clear without scleral icterus. Heart:  S1, S2 present without murmurs appreciated. Lungs:  Clear to auscultation bilaterally. No wheezes, rales, or rhonchi. No distress.  Abdomen:  +BS, soft, non-tender and non-distended. No rebound or guarding. No HSM or masses noted. Msk:  Symmetrical without gross deformities. Normal posture. Extremities:  Without edema. Neurologic:  Alert and  oriented x4 Psych:  Normal mood and affect.

## 2019-11-04 ENCOUNTER — Other Ambulatory Visit: Payer: Self-pay

## 2019-11-04 ENCOUNTER — Ambulatory Visit: Payer: Medicare Other | Admitting: Gastroenterology

## 2019-11-04 ENCOUNTER — Encounter: Payer: Self-pay | Admitting: Gastroenterology

## 2019-11-04 VITALS — BP 152/95 | HR 66 | Temp 96.8°F | Ht 63.0 in | Wt 172.8 lb

## 2019-11-04 DIAGNOSIS — K580 Irritable bowel syndrome with diarrhea: Secondary | ICD-10-CM

## 2019-11-04 DIAGNOSIS — K219 Gastro-esophageal reflux disease without esophagitis: Secondary | ICD-10-CM | POA: Diagnosis not present

## 2019-11-04 NOTE — Patient Instructions (Signed)
Continue your current medications.  It is fine to take Pepcid daily and use Protonix as needed. Keep in mind, if reflux symptoms become more frequent or if you have return of upper abdominal discomfort, you should resume Protonix daily.  Continue avoiding fried, fatty, greasy, spicy, citrus foods. Avoid caffeine, carbonated beverages, and soda. Do not eat within 3 hours of laying down.  We will plan to see you back in 6 months. Hopefully we can schedule your colonoscopy at that time. Call if you have questions or concerns prior.  Aliene Altes, PA-C [facility]

## 2019-11-04 NOTE — Assessment & Plan Note (Addendum)
Well-controlled. Patient has started taking Pepcid 20 mg daily and using Protonix only as needed (maybe once every 2 weeks) and is pleased with results. Breakthrough symptoms are associated with known dietary triggers which she tries to avoid. No alarm symptoms. She does admit to chronic occasional dysphagia to peanut butter but denies dysphagia to other foods or liquids. This has been present for 10 years without worsening. Last EGD in January 2020 with mild erosive reflux esophagitis, gastric polyps, otherwise normal. Patient does not feel symptoms are significant enough to need evaluation or treatment.  Continue current medications. She was advised if she had return of upper abdominal discomfort as she had in January 2020, she should resume taking Protonix 40 mg daily. Continue monitoring dysphagia symptoms. Follow up in 6 months.

## 2019-11-04 NOTE — Assessment & Plan Note (Addendum)
Symptoms are well controlled at this time. Taking Gut alive, probiotic, and Metamucil daily. Typically 1-2 soft, formed BMs daily. Notes coffee will cause diarrhea so she has limited this. Otherwise, rare diarrhea. Denies abdominal pain, bright red blood per rectum, melena, or unintentional weight loss. Last colonoscopy in 2008 and was due for repeat in 2018. She again wants to delay scheduling due to COVID-19. States she would like to pursue this in the spring or summer after she receives her vaccination.  Continue current medications. Use Imodium as needed. Follow-up in 6 months. Hopefully schedule colonoscopy at that time.

## 2019-12-18 DIAGNOSIS — I10 Essential (primary) hypertension: Secondary | ICD-10-CM | POA: Diagnosis not present

## 2019-12-18 DIAGNOSIS — E7849 Other hyperlipidemia: Secondary | ICD-10-CM | POA: Diagnosis not present

## 2019-12-18 DIAGNOSIS — E039 Hypothyroidism, unspecified: Secondary | ICD-10-CM | POA: Diagnosis not present

## 2020-01-14 DIAGNOSIS — L258 Unspecified contact dermatitis due to other agents: Secondary | ICD-10-CM | POA: Diagnosis not present

## 2020-02-17 DIAGNOSIS — E039 Hypothyroidism, unspecified: Secondary | ICD-10-CM | POA: Diagnosis not present

## 2020-02-17 DIAGNOSIS — E7849 Other hyperlipidemia: Secondary | ICD-10-CM | POA: Diagnosis not present

## 2020-02-17 DIAGNOSIS — I10 Essential (primary) hypertension: Secondary | ICD-10-CM | POA: Diagnosis not present

## 2020-03-04 ENCOUNTER — Encounter (HOSPITAL_COMMUNITY): Payer: Self-pay | Admitting: Emergency Medicine

## 2020-03-04 ENCOUNTER — Emergency Department (HOSPITAL_COMMUNITY)
Admission: EM | Admit: 2020-03-04 | Discharge: 2020-03-04 | Disposition: A | Discharge status: Home / Self Care | Payer: Medicare Other | Attending: Emergency Medicine | Admitting: Emergency Medicine

## 2020-03-04 ENCOUNTER — Other Ambulatory Visit: Payer: Self-pay

## 2020-03-04 DIAGNOSIS — I10 Essential (primary) hypertension: Secondary | ICD-10-CM | POA: Insufficient documentation

## 2020-03-04 DIAGNOSIS — N3001 Acute cystitis with hematuria: Secondary | ICD-10-CM | POA: Diagnosis not present

## 2020-03-04 DIAGNOSIS — R319 Hematuria, unspecified: Secondary | ICD-10-CM | POA: Diagnosis present

## 2020-03-04 LAB — URINALYSIS, ROUTINE W REFLEX MICROSCOPIC
Bilirubin Urine: NEGATIVE
Glucose, UA: NEGATIVE mg/dL
Ketones, ur: NEGATIVE mg/dL
Nitrite: NEGATIVE
Protein, ur: NEGATIVE mg/dL
Specific Gravity, Urine: 1.004 — ABNORMAL LOW (ref 1.005–1.030)
pH: 6 (ref 5.0–8.0)

## 2020-03-04 MED ORDER — CEPHALEXIN 500 MG PO CAPS: 500.0000 mg | ORAL_CAPSULE | Freq: Two times a day (BID) | ORAL | 0 refills | Status: DC

## 2020-03-04 NOTE — Discharge Instructions (Addendum)
Take antibiotics as prescribed and follow-up with your doctor if he still symptoms after the weekend.  Return to the emergency room if you develop fevers, chills, vomiting or other new symptoms use. Use Tylenol every 4 hours as needed for pain.

## 2020-03-04 NOTE — ED Triage Notes (Signed)
Pt states she started noticing blood in her urine around 11pm last night. Pt states she is having urinary frequency as well but states that when she does go; she "just dribbles" and that it is painful.

## 2020-03-04 NOTE — ED Provider Notes (Signed)
[facility] EMERGENCY DEPARTMENT Provider Note   CSN: [id number] Arrival date & time: 03/04/20  0602     History Chief Complaint  Patient presents with  . Hematuria    Anna Gibson is a 74 y.o. female.  Patient with history of reflux, high blood pressure presents with urinary frequency and hematuria since last night.  Sudden onset.  No flank pain fevers or chills or vomiting.  Patient has not had a urine infection since she was in her 39s.  No history of kidney stones.  No other new concerns.        Past Medical History:  Diagnosis Date  . Burning sensation of mouth 08/13/2014  . GERD (gastroesophageal reflux disease)   . HTN (hypertension), though recently low BP 09/29/2011  . Hypertension   . Vitamin D deficiency     Patient Active Problem List   Diagnosis Date Noted  . Rectal bleeding 12/14/2016  . 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 GERD (gastroesophageal reflux disease) 01/11/2012  . Dysphagia 01/11/2012  . IBS (irritable bowel syndrome) 01/11/2012  . Hypercalcemia 09/30/2011  . Chest pain, increasing over several days.  Patent coronary arteries by cardiac cath 09/29/11 09/29/2011  . HTN (hypertension), though recently low BP 09/29/2011    Past Surgical History:  Procedure Laterality Date  . BIOPSY  09/21/2018   Procedure: BIOPSY;  Surgeon: Daneil Dolin, MD;  [facility]: [facility];  Service: Endoscopy;;  gastric  . CARDIAC CATHETERIZATION    . CESAREAN SECTION    . CESAREAN SECTION    . CHOLECYSTECTOMY    . COLONOSCOPY  08/14/2007   Rare sigmoid colon diverticula.  Random colon biopsies benign.  Repeat colonoscopy in 10 years.  . ESOPHAGOGASTRODUODENOSCOPY  08/14/2007    2 cm hiatal hernia, multiple sessile gastric polyps in the fundus, multiple erosions in the antrum, normal duodenum/Gastritis is the likely cause for her abdominal pain.  No H. pylori or celiac sprue.  . ESOPHAGOGASTRODUODENOSCOPY N/A 09/21/2018   Dr. Gala Romney: mild erosive reflux  esophagitis, few gastric polyps s/p resection and retrieval, normal duodenum, fundic gland polyp  . FLEXIBLE SIGMOIDOSCOPY N/A 12/30/2016   non-thrombosed external hemorrhoids, redundant rectosigmoid colon, internal hemorrhoids  . LEFT HEART CATHETERIZATION WITH CORONARY ANGIOGRAM N/A 09/29/2011   Procedure: LEFT HEART CATHETERIZATION WITH CORONARY ANGIOGRAM;  Surgeon: Lorretta Harp, MD;  Location: [facility] CATH LAB;  Service: Cardiovascular;  Laterality: N/A;     OB History   No obstetric history on file.     Family History  Problem Relation Age of Onset  . Congenital heart disease Mother   . Coronary artery disease Brother   . Colon cancer Neg Hx     Social History   Tobacco Use  . Smoking status: Never Smoker  . Smokeless tobacco: Never Used  Vaping Use  . Vaping Use: Never used  Substance Use Topics  . Alcohol use: No  . Drug use: No    Home Medications Prior to Admission medications   Medication Sig Start Date End Date Taking? Authorizing Provider  acetaminophen (TYLENOL) 650 MG CR tablet Take 650-1,300 mg by mouth daily as needed for pain.    [provider]  Biotin 2500 MCG CAPS Take 1 capsule by mouth daily.    [provider]  bisoprolol-hydrochlorothiazide (ZIAC) 5-6.25 MG tablet Take 1 tablet by mouth daily.    [provider]  Calcium Carbonate-Vitamin D (CALCIUM 600+D PO) Take 1 tablet by mouth daily.     [provider]  famotidine (PEPCID) 20 MG tablet Take 20 mg by mouth daily. Takes 6 days out of the week.    [provider]  Magnesium 400 MG TABS Take 1 tablet by mouth daily.    [provider]  Multiple Vitamins-Minerals (WOMENS 50+ MULTI VITAMIN/MIN PO) Take 1 tablet by mouth daily.    [provider]  OVER THE COUNTER MEDICATION Gut Alive 1 capsule twice daily    [provider]  pantoprazole (PROTONIX) 40 MG tablet 1 PO 30 MINS PRIOR TO YOUR FIRST MEAL Patient taking differently: as  needed. 1 PO 30 MINS PRIOR TO YOUR FIRST MEAL 04/30/19   Erenest Rasher, PA-C  Probiotic Product (PROBIOTIC DAILY PO) Take 1 tablet by mouth daily.    [provider]  psyllium (METAMUCIL) 58.6 % powder Take 1 packet by mouth daily at 3 pm. SUGAR FREE-ORANGE FLAVORED     [provider]    Allergies    Indomethacin  Review of Systems   Review of Systems  Constitutional: Negative for chills and fever.  HENT: Negative for congestion.   Eyes: Negative for visual disturbance.  Respiratory: Negative for shortness of breath.   Cardiovascular: Negative for chest pain.  Gastrointestinal: Negative for abdominal pain and vomiting.  Genitourinary: Positive for dysuria and frequency. Negative for flank pain.  Musculoskeletal: Negative for back pain, neck pain and neck stiffness.  Skin: Negative for rash.  Neurological: Negative for light-headedness and headaches.    Physical Exam Updated Vital Signs BP (!) 182/85 (BP Location: Left Arm)   Pulse 70   Temp 99.1 F (37.3 C) (Oral)   Resp 16   Ht 5\' 3"  (1.6 m)   Wt 78 kg   SpO2 99%   BMI 30.47 kg/m   Physical Exam Vitals and nursing note reviewed.  Constitutional:      Appearance: She is well-developed.  HENT:     Head: Normocephalic.  Eyes:     General:        Right eye: No discharge.        Left eye: No discharge.     Conjunctiva/sclera: Conjunctivae normal.  Neck:     Trachea: No tracheal deviation.  Cardiovascular:     Rate and Rhythm: Normal rate.  Pulmonary:     Effort: Pulmonary effort is normal.  Abdominal:     General: There is no distension.     Palpations: Abdomen is soft.     Tenderness: There is abdominal tenderness (mild suprapubic). There is no guarding.  Musculoskeletal:        General: No tenderness (no flank tenderness).     Cervical back: Normal range of motion and neck supple.  Skin:    General: Skin is warm.     Findings: No rash.  Neurological:     Mental Status: She is alert  and oriented to person, place, and time.     ED Results / Procedures / Treatments   Labs (all labs ordered are listed, but only abnormal results are displayed) Labs Reviewed  URINALYSIS, ROUTINE W REFLEX MICROSCOPIC - Abnormal; Notable for the following components:      Result Value   Color, Urine STRAW (*)    Specific Gravity, Urine 1.004 (*)    Hgb urine dipstick LARGE (*)    Leukocytes,Ua LARGE (*)    Bacteria, UA RARE (*)    All other components within normal limits    EKG None  Radiology No results found.  Procedures Procedures (  including critical care time)  Medications Ordered in ED Medications - No data to display  ED Course  I have reviewed the triage vital signs and the nursing notes.  Pertinent labs & imaging results that were available during my care of the patient were reviewed by me and considered in my medical decision making (see chart for details).    MDM Rules/Calculators/A&P                          Patient presents with clinical concern for acute cystitis.  No signs of pyelonephritis on exam.  Patient is well-appearing overall and no fever.  Urinalysis ordered and reviewed consistent with infection with large hemoglobin, large leukocytes.  Plan for oral antibiotics and follow-up with primary doctor. Final Clinical Impression(s) / ED Diagnoses Final diagnoses:  Acute cystitis with hematuria    Rx / DC Orders ED Discharge Orders    None       Elnora Morrison, MD 03/04/20 [phone]

## 2020-04-17 DIAGNOSIS — I10 Essential (primary) hypertension: Secondary | ICD-10-CM | POA: Diagnosis not present

## 2020-04-17 DIAGNOSIS — E7849 Other hyperlipidemia: Secondary | ICD-10-CM | POA: Diagnosis not present

## 2020-04-17 DIAGNOSIS — E039 Hypothyroidism, unspecified: Secondary | ICD-10-CM | POA: Diagnosis not present

## 2020-05-05 NOTE — Progress Notes (Signed)
Referring Provider: Sharilyn Sites, MD Primary Care Physician:  Sharilyn Sites, MD Primary GI Physician: Dr. Abbey Chatters  Chief Complaint  Patient presents with  . Hemorrhoids    ibs and gerd managed with meds per pt    HPI:   Anna Gibson is a 74 y.o. female presenting today She has GIhistorysignificant for GERD and IBS. Last EGD on 09/21/18 which revealed mild erosive reflux esophagitis, benign gastric polyps, otherwise normal. Last TCS 2008; due for repeat in 2018. Flex sig in 2018 with internal and external hemorrhoids, diverticulosis, redundant rectosigmoid colon.   Last seen in our office 11/04/2019. Taking famotidine daily and Protonix as needed which kept GERD well controlled. IBS well managed with gut Alive, probiotic, and Metamucil daily. Typically with 1-2 BMs daily with diarrhea triggered by coffee. No alarm symptoms. She preferred to hold off on scheduling colonoscopy due to COVID-19.  Today:  GERD: Well controlled on with pepcid daily and Protonix as needed. No dysphagia, nausea, vomiting, or abdominal pain.   IBS: Continues with same regimen of gut alive, probiotic, and Metamucil daily. BMs 1-2 times a day. Stools are soft. PCP started her on Xanex at bedtime. Since starting this, her diarrhea has resolved.   Hemorrhoids: Has had hemorrhoids since she had her last child. Feels they have become more bothersome x 5 months ago. Associated burning, itching. Sitting in hot bath will help. Using preparation H cream twice a day. Intermittent blood on toilet tissue; occurs every other day. Hemorrhoids are usually on the outside.   Reports there is a small opening beside her anal opening sometimes a small amount of "old blood" comes out. Some tenderness prior to draining but resolves. This occurs maybe once every couple of weeks.  This came up 5-6 months ago.    Past Medical History:  Diagnosis Date  . Burning sensation of mouth 08/13/2014  . GERD (gastroesophageal reflux disease)    . HTN (hypertension), though recently low BP 09/29/2011  . Hypertension   . Vitamin D deficiency     Past Surgical History:  Procedure Laterality Date  . BIOPSY  09/21/2018   Procedure: BIOPSY;  Surgeon: Daneil Dolin, MD;  Location: [facility];  Service: Endoscopy;;  gastric  . CARDIAC CATHETERIZATION    . CESAREAN SECTION    . CESAREAN SECTION    . CHOLECYSTECTOMY    . COLONOSCOPY  08/14/2007   Rare sigmoid colon diverticula.  Random colon biopsies benign.  Repeat colonoscopy in 10 years.  . ESOPHAGOGASTRODUODENOSCOPY  08/14/2007    2 cm hiatal hernia, multiple sessile gastric polyps in the fundus, multiple erosions in the antrum, normal duodenum/Gastritis is the likely cause for her abdominal pain.  No H. pylori or celiac sprue.  . ESOPHAGOGASTRODUODENOSCOPY N/A 09/21/2018   Dr. Gala Romney: mild erosive reflux esophagitis, few gastric polyps s/p resection and retrieval, normal duodenum, fundic gland polyp  . FLEXIBLE SIGMOIDOSCOPY N/A 12/30/2016   non-thrombosed external hemorrhoids, redundant rectosigmoid colon, internal hemorrhoids  . LEFT HEART CATHETERIZATION WITH CORONARY ANGIOGRAM N/A 09/29/2011   Procedure: LEFT HEART CATHETERIZATION WITH CORONARY ANGIOGRAM;  Surgeon: Lorretta Harp, MD;  Location: [facility] CATH LAB;  Service: Cardiovascular;  Laterality: N/A;    Current Outpatient Medications  Medication Sig Dispense Refill  . acetaminophen (TYLENOL) 650 MG CR tablet Take 650-1,300 mg by mouth as needed for pain.     Marland Kitchen ALPRAZolam (XANAX) 1 MG tablet Take 1 mg by mouth at bedtime.    . Biotin 2500 MCG CAPS  Take 1 capsule by mouth daily.    . bisoprolol-hydrochlorothiazide (ZIAC) 5-6.25 MG tablet Take 1 tablet by mouth daily.    . famotidine (PEPCID) 20 MG tablet Take 20 mg by mouth as needed. Takes 6 days out of the week.     . Magnesium 400 MG TABS Take 1 tablet by mouth daily.    . Multiple Vitamins-Minerals (WOMENS 50+ MULTI VITAMIN/MIN PO) Take 1 tablet by mouth daily.    Marland Kitchen  OVER THE COUNTER MEDICATION daily. Gut Alive 1 capsule once daily    . pantoprazole (PROTONIX) 40 MG tablet 1 PO 30 MINS PRIOR TO YOUR FIRST MEAL (Patient taking differently: as needed. 1 PO 30 MINS PRIOR TO YOUR FIRST MEAL) 90 tablet 3  . Probiotic Product (PROBIOTIC DAILY PO) Take 1 tablet by mouth daily.    . psyllium (METAMUCIL) 58.6 % powder Take 1 packet by mouth daily at 3 pm. SUGAR FREE-ORANGE FLAVORED     . hydrocortisone (ANUSOL-HC) 2.5 % rectal cream Place 1 application rectally 2 (two) times daily. 30 g 1  . lidocaine (XYLOCAINE) 5 % ointment Apply 1 application topically 2 (two) times daily as needed. 35.44 g 1   No current facility-administered medications for this visit.    Allergies as of 05/06/2020 - Review Complete 05/06/2020  Allergen Reaction Noted  . Indomethacin Other (See Comments) 09/29/2011    Family History  Problem Relation Age of Onset  . Congenital heart disease Mother   . Coronary artery disease Brother   . Colon cancer Neg Hx     Social History   Socioeconomic History  . Marital status: Married    Spouse name: Not on file  . Number of children: Not on file  . Years of education: Not on file  . Highest education level: Not on file  Occupational History  . Not on file  Tobacco Use  . Smoking status: Never Smoker  . Smokeless tobacco: Never Used  Vaping Use  . Vaping Use: Never used  Substance and Sexual Activity  . Alcohol use: No  . Drug use: No  . Sexual activity: Yes  Other Topics Concern  . Not on file  Social History Narrative  . Not on file   Social Determinants of Health   Financial Resource Strain:   . Difficulty of Paying Living Expenses:   Food Insecurity:   . Worried About Charity fundraiser in the Last Year:   . Arboriculturist in the Last Year:   Transportation Needs:   . Film/video editor (Medical):   Marland Kitchen Lack of Transportation (Non-Medical):   Physical Activity:   . Days of Exercise per Week:   . Minutes of  Exercise per Session:   Stress:   . Feeling of Stress :   Social Connections:   . Frequency of Communication with Friends and Family:   . Frequency of Social Gatherings with Friends and Family:   . Attends Religious Services:   . Active Member of Clubs or Organizations:   . Attends Archivist Meetings:   Marland Kitchen Marital Status:     Review of Systems: Gen: Denies fever, chills, cold or flulike symptoms, lightheadedness, dizziness, presyncope, syncope CV: Denies chest pain or heart palpitations Resp: Denies dyspnea or cough. GI: See HPI Heme: See HPI.  Physical Exam: BP (!) 166/90   Pulse 65   Temp 99.2 F (37.3 C) (Oral)   Ht 5\' 4"  (1.626 m)   Wt 176 lb 12.8 oz (  80.2 kg)   BMI 30.35 kg/m  General:   Alert and oriented. No distress noted. Pleasant and cooperative.  Head:  Normocephalic and atraumatic. Eyes:  Conjuctiva clear without scleral icterus. Heart:  S1, S2 present without murmurs appreciated. Lungs:  Clear to auscultation bilaterally. No wheezes, rales, or rhonchi. No distress.  Abdomen:  +BS, soft, non-tender and non-distended. No rebound or guarding. No HSM or masses noted. Rectal: Nonthrombosed external hemorrhoids, just posterior to the anal opening, there is a very small almost pinhole like or pore in the skin without erythema, induration, drainage, tenderness, or obvious track.  Internal exam with no significant findings.  No bright red blood on exam. Msk:  Symmetrical without gross deformities. Normal posture. Extremities:  Without edema. Neurologic:  Alert and  oriented x4 Psych: Normal mood and affect.

## 2020-05-06 ENCOUNTER — Other Ambulatory Visit: Payer: Self-pay

## 2020-05-06 ENCOUNTER — Encounter: Payer: Self-pay | Admitting: Gastroenterology

## 2020-05-06 ENCOUNTER — Ambulatory Visit: Payer: Medicare Other | Admitting: Gastroenterology

## 2020-05-06 VITALS — BP 166/90 | HR 65 | Temp 99.2°F | Ht 64.0 in | Wt 176.8 lb

## 2020-05-06 DIAGNOSIS — K625 Hemorrhage of anus and rectum: Secondary | ICD-10-CM

## 2020-05-06 DIAGNOSIS — K219 Gastro-esophageal reflux disease without esophagitis: Secondary | ICD-10-CM | POA: Diagnosis not present

## 2020-05-06 DIAGNOSIS — K580 Irritable bowel syndrome with diarrhea: Secondary | ICD-10-CM

## 2020-05-06 DIAGNOSIS — K649 Unspecified hemorrhoids: Secondary | ICD-10-CM

## 2020-05-06 HISTORY — DX: Unspecified hemorrhoids: K64.9

## 2020-05-06 MED ORDER — LIDOCAINE 5 % EX OINT: 1.0000 "application " | TOPICAL_OINTMENT | Freq: Two times a day (BID) | CUTANEOUS | 1 refills | Status: DC | PRN

## 2020-05-06 MED ORDER — HYDROCORTISONE (PERIANAL) 2.5 % EX CREA: 1.0000 "application " | TOPICAL_CREAM | Freq: Two times a day (BID) | CUTANEOUS | 1 refills | Status: DC

## 2020-05-06 NOTE — Assessment & Plan Note (Signed)
Chronic history of IBS with diarrhea.  Symptoms well controlled with gut to live, probiotic, and Metamucil daily.  Notably, PCP recently started her on Xanax at bedtime to help with her nerves which has also resolved diarrhea completely.  Advise she continue her current medications and monitor symptoms.

## 2020-05-06 NOTE — Assessment & Plan Note (Addendum)
Patient has history of IBS-D and intermittent toilet tissue hematochezia in the setting of known internal and external hemorrhoids.  Feels hemorrhoids have become more bothersome over the last 5 months using Preparation H twice daily.  Also notes a small opening beside her anal opening that sometimes drains a small amount of "old blood" which started 5 months ago.  She is overdue for colonoscopy.  Last complete colonoscopy in 2008 with no polyps and recommendations to repeat in 2018.  She did have flex sig in 2018 for hematochezia revealing internal and external hemorrhoids, redundant sigmoid colon, and diverticulosis.  Rectal exam today with nonthrombosed external hemorrhoids, just posterior to her anal opening, there is a small almost pinhole-like or pore in the skin without erythema, induration, drainage, tenderness, or obvious tract.  Internal exam with no significant findings.  No bright red blood on exam.  Suspect rectal bleeding is likely secondary to hemorrhoids.  Cannot rule out colon polyps or malignancy.  Not sure of the significance of the additional perianal abnormality.  Suspected possible pilonidal cyst although not in the typical location, ?healing abscess.  She does not have symptoms to suggest Crohn's disease.  Plan: Proceed with colonoscopy with propofol with Dr. Abbey Chatters in the near future. Dr. Abbey Chatters can also assess the perianal abnormality at the time of colonoscopy. The risks, benefits, and alternatives have been discussed in detail with patient. They have stated understanding and desire to proceed.  ASA II Anusol cream twice daily x10 days then as needed Lidocaine ointment 5% to twice daily as needed Discussed consideration of surgical referral for hemorrhoid management if creams do not adequately manage hemorrhoid symptoms Advised patient to continue to monitor perianal abnormality and let me know if symptoms worsen or if it becomes painful.  May need to consider CT  pelvis. Follow-up after colonoscopy.

## 2020-05-06 NOTE — Assessment & Plan Note (Deleted)
Patient has history of IBS-D and intermittent toilet tissue hematochezia in the setting of known internal and external hemorrhoids.  Feels hemorrhoids have become more bothersome over the last 5 months using Preparation H twice daily.  Also notes a small opening inside her anal canal that sometimes it drains a small amount of "old blood" which started 5 months ago.  She is overdue for colonoscopy.  Last complete colonoscopy in 2008 with no polyps and recommendations to repeat in 2018.  She did have flex sig in 2018 for hematochezia revealing internal and external hemorrhoids, redundant sigmoid colon, and diverticulosis.  Rectal exam today with nonthrombosed external hemorrhoids, just posterior to her anal opening, there is a small almost pinhole-like wart for in the skin without erythema, induration, drainage, tenderness, or obvious tract.  Internal exam with no significant findings.  No bright red blood on exam.  Suspect rectal bleeding is likely secondary to hemorrhoids.  Cannot rule out colon polyps or malignancy.  Not sure of the significance of the additional perianal abnormality.  Suspected possible pilonidal cyst although not in the typical location, ?healing abscess.  She does not have symptoms to suggest Crohn's disease.  Plan: Proceed with colonoscopy with propofol with Dr. Abbey Chatters in the near future. ASA II Anusol cream twice daily x10 days then as needed Lidocaine ointment 5% to twice daily as needed Discussed consideration of surgical referral for hemorrhoid management if creams do not adequately manage hemorrhoid symptoms Advised patient to continue to monitor perianal abnormality and let me know if symptoms worsen or if it becomes painful.  May need to consider CT pelvis. Follow-up after colonoscopy.

## 2020-05-06 NOTE — Assessment & Plan Note (Signed)
Well-controlled on Pepcid daily and Protonix as needed.  No alarm symptoms.  Advise she continue her current medications.

## 2020-05-06 NOTE — Assessment & Plan Note (Addendum)
Addressed under rectal bleeding 

## 2020-05-06 NOTE — Patient Instructions (Addendum)
We will get you scheduled for a colonoscopy in the near future with Dr. Abbey Chatters.   I am sending in Anusol cream. Apply this twice daily x10 days then as needed.   I am also sending in Lidocaine ointment. You can apply this twice daily as needed for rectal discomfort.   Otherwise continue your current medications.   We will plan to see you back in about 4 months. Please continue to monitor drainage from the little opening at your rectum. If this worsens or becomes painful, please let me know.     Aliene Altes, PA-C [facility]

## 2020-05-07 ENCOUNTER — Telehealth: Payer: Self-pay

## 2020-05-07 NOTE — Telephone Encounter (Signed)
Tried to call pt to schedule TCS w/Prop w/[place] ASA 2.

## 2020-05-11 NOTE — Telephone Encounter (Signed)
Tried to call pt to schedule procedure, no answer.

## 2020-05-12 NOTE — Telephone Encounter (Signed)
Spoke to pt, TCS w/Dr. Abbey Chatters scheduled for 06/01/20 at 2:00pm. COVID test 05/29/20 at 1:15pm. Orders entered. Appt letter mailed with procedure instructions.

## 2020-05-13 ENCOUNTER — Other Ambulatory Visit: Payer: Self-pay | Admitting: *Deleted

## 2020-05-13 DIAGNOSIS — K625 Hemorrhage of anus and rectum: Secondary | ICD-10-CM

## 2020-05-29 ENCOUNTER — Other Ambulatory Visit (HOSPITAL_COMMUNITY)
Admission: RE | Admit: 2020-05-29 | Discharge: 2020-05-29 | Disposition: A | Discharge status: Home / Self Care | Payer: Medicare Other | Source: Ambulatory Visit | Attending: Internal Medicine | Admitting: Internal Medicine

## 2020-05-29 ENCOUNTER — Other Ambulatory Visit: Payer: Self-pay

## 2020-05-29 DIAGNOSIS — Z20822 Contact with and (suspected) exposure to covid-19: Secondary | ICD-10-CM | POA: Diagnosis not present

## 2020-05-29 DIAGNOSIS — Z01812 Encounter for preprocedural laboratory examination: Secondary | ICD-10-CM | POA: Diagnosis not present

## 2020-05-29 LAB — BASIC METABOLIC PANEL
Anion gap: 7 (ref 5–15)
BUN: 14 mg/dL (ref 8–23)
CO2: 30 mmol/L (ref 22–32)
Calcium: 9.7 mg/dL (ref 8.9–10.3)
Chloride: 100 mmol/L (ref 98–111)
Creatinine, Ser: 0.8 mg/dL (ref 0.44–1.00)
GFR calc Af Amer: >60 mL/min (ref >60)
GFR calc non Af Amer: >60 mL/min (ref >60)
Glucose, Bld: 101 mg/dL — ABNORMAL HIGH (ref 70–99)
Potassium: 4.4 mmol/L (ref 3.5–5.1)
Sodium: 137 mmol/L (ref 135–145)

## 2020-05-30 LAB — SARS CORONAVIRUS 2 (TAT 6-24 HRS): SARS Coronavirus 2: NEGATIVE

## 2020-05-31 ENCOUNTER — Encounter (HOSPITAL_COMMUNITY): Payer: Self-pay | Admitting: Anesthesiology

## 2020-06-01 ENCOUNTER — Telehealth: Payer: Self-pay | Admitting: Internal Medicine

## 2020-06-01 ENCOUNTER — Ambulatory Visit (HOSPITAL_COMMUNITY): Admission: RE | Admit: 2020-06-01 | Payer: Medicare Other | Source: Home / Self Care

## 2020-06-01 ENCOUNTER — Other Ambulatory Visit: Payer: Self-pay | Admitting: Gastroenterology

## 2020-06-01 DIAGNOSIS — R112 Nausea with vomiting, unspecified: Secondary | ICD-10-CM

## 2020-06-01 SURGERY — COLONOSCOPY WITH PROPOFOL
Anesthesia: Monitor Anesthesia Care

## 2020-06-01 MED ORDER — ONDANSETRON HCL 4 MG PO TABS: 4.0000 mg | ORAL_TABLET | Freq: Three times a day (TID) | ORAL | 0 refills | Status: DC | PRN

## 2020-06-01 NOTE — Telephone Encounter (Signed)
Endo called asking if patient was still on the schedule for today. Please call Endo [phone]

## 2020-06-01 NOTE — Telephone Encounter (Signed)
ENDO CALLED AND SAID PATIENT HUSBAND CALLED TO CANCEL HER PROCEDURE, STATED SHE WAS VERY SICK AND WILL CALL HERE LATER TO FIND OUT WHAT TO DO

## 2020-06-01 NOTE — Telephone Encounter (Signed)
Pt's husband has called back again asking about what recommendations we have that her condition was getting to be urgent. I told him the nurse was waiting for a reply back from the provider and someone would call.

## 2020-06-01 NOTE — Telephone Encounter (Signed)
Noted. Rx for Zofran sent to pharmacy.

## 2020-06-01 NOTE — Telephone Encounter (Signed)
Anitra from Endo called asking if patient is still on the schedule for today. I told her the husband has called multiple times today and we were waiting on provider recommendations. Please call Endo at [phone]

## 2020-06-01 NOTE — Telephone Encounter (Signed)
Spoke with pts husband, they want to cancel procedure. Called endo and spoke with Anitra.

## 2020-06-01 NOTE — Telephone Encounter (Signed)
I am sorry to hear this. I can send in Zofran to help with her nausea. She will likely continue to have BMs for now as this is her prep working. We will need to try to get this rescheduled in the future if she is agreeable. We can possibly try a different prep and Zofran during to prep to help with any nausea.   Please let me know if she would like Rx for Zofran.

## 2020-06-01 NOTE — Telephone Encounter (Signed)
Spoke with the pts husbandGwyndolyn Saxon, he said the pt started her prep yesterday and followed all the instructions but it took a very long time for the prep to start working. She started vomiting around 9:30 last night and then she started having bowel movements. He said she is still having BM's and is nauseous and her stomach is burning.  She is not having her procedure today. He wants to know if there is anything we can do or give her to help her feel better. Please advise.

## 2020-06-01 NOTE — Telephone Encounter (Signed)
Yes, she would like to have rx sent in to [place]. She does not want to be rescheduled right now and will call back when she's ready.

## 2020-06-01 NOTE — Telephone Encounter (Signed)
noted 

## 2020-06-18 DIAGNOSIS — I10 Essential (primary) hypertension: Secondary | ICD-10-CM | POA: Diagnosis not present

## 2020-06-18 DIAGNOSIS — E039 Hypothyroidism, unspecified: Secondary | ICD-10-CM | POA: Diagnosis not present

## 2020-06-18 DIAGNOSIS — E7849 Other hyperlipidemia: Secondary | ICD-10-CM | POA: Diagnosis not present

## 2020-08-11 DIAGNOSIS — M5416 Radiculopathy, lumbar region: Secondary | ICD-10-CM | POA: Diagnosis not present

## 2020-08-11 DIAGNOSIS — M4316 Spondylolisthesis, lumbar region: Secondary | ICD-10-CM | POA: Diagnosis not present

## 2020-08-11 DIAGNOSIS — I10 Essential (primary) hypertension: Secondary | ICD-10-CM | POA: Diagnosis not present

## 2020-08-18 DIAGNOSIS — I10 Essential (primary) hypertension: Secondary | ICD-10-CM | POA: Diagnosis not present

## 2020-08-18 DIAGNOSIS — E039 Hypothyroidism, unspecified: Secondary | ICD-10-CM | POA: Diagnosis not present

## 2020-08-18 DIAGNOSIS — E7849 Other hyperlipidemia: Secondary | ICD-10-CM | POA: Diagnosis not present

## 2020-09-07 DIAGNOSIS — M5416 Radiculopathy, lumbar region: Secondary | ICD-10-CM | POA: Diagnosis not present

## 2020-09-24 DIAGNOSIS — I10 Essential (primary) hypertension: Secondary | ICD-10-CM | POA: Diagnosis not present

## 2020-09-24 DIAGNOSIS — E7849 Other hyperlipidemia: Secondary | ICD-10-CM | POA: Diagnosis not present

## 2020-09-24 DIAGNOSIS — Z0001 Encounter for general adult medical examination with abnormal findings: Secondary | ICD-10-CM | POA: Diagnosis not present

## 2020-09-24 DIAGNOSIS — E039 Hypothyroidism, unspecified: Secondary | ICD-10-CM | POA: Diagnosis not present

## 2020-09-24 DIAGNOSIS — R7309 Other abnormal glucose: Secondary | ICD-10-CM | POA: Diagnosis not present

## 2020-09-24 DIAGNOSIS — Z1389 Encounter for screening for other disorder: Secondary | ICD-10-CM | POA: Diagnosis not present

## 2020-10-06 DIAGNOSIS — M4316 Spondylolisthesis, lumbar region: Secondary | ICD-10-CM | POA: Diagnosis not present

## 2020-10-06 DIAGNOSIS — M5416 Radiculopathy, lumbar region: Secondary | ICD-10-CM | POA: Diagnosis not present

## 2020-12-14 ENCOUNTER — Telehealth: Payer: Self-pay

## 2020-12-14 NOTE — Telephone Encounter (Signed)
Pt called and would like her medical records faxed to [facility], [place], [place] [address], fax [phone]. Pt will be transferring care there.

## 2020-12-15 NOTE — Telephone Encounter (Signed)
Cc'ed records to [facility]

## 2020-12-16 DIAGNOSIS — E039 Hypothyroidism, unspecified: Secondary | ICD-10-CM | POA: Diagnosis not present

## 2020-12-16 DIAGNOSIS — I10 Essential (primary) hypertension: Secondary | ICD-10-CM | POA: Diagnosis not present

## 2020-12-16 DIAGNOSIS — E7849 Other hyperlipidemia: Secondary | ICD-10-CM | POA: Diagnosis not present

## 2020-12-31 DIAGNOSIS — M5416 Radiculopathy, lumbar region: Secondary | ICD-10-CM | POA: Diagnosis not present

## 2021-01-14 DIAGNOSIS — K209 Esophagitis, unspecified without bleeding: Secondary | ICD-10-CM | POA: Diagnosis not present

## 2021-01-14 DIAGNOSIS — Z8719 Personal history of other diseases of the digestive system: Secondary | ICD-10-CM | POA: Diagnosis not present

## 2021-01-14 DIAGNOSIS — R197 Diarrhea, unspecified: Secondary | ICD-10-CM | POA: Diagnosis not present

## 2021-02-02 DIAGNOSIS — M4316 Spondylolisthesis, lumbar region: Secondary | ICD-10-CM | POA: Diagnosis not present

## 2021-02-02 DIAGNOSIS — M5416 Radiculopathy, lumbar region: Secondary | ICD-10-CM | POA: Diagnosis not present

## 2021-02-03 ENCOUNTER — Other Ambulatory Visit: Payer: Self-pay | Admitting: Neurosurgery

## 2021-02-03 DIAGNOSIS — M4316 Spondylolisthesis, lumbar region: Secondary | ICD-10-CM

## 2021-02-16 DIAGNOSIS — I10 Essential (primary) hypertension: Secondary | ICD-10-CM | POA: Diagnosis not present

## 2021-02-16 DIAGNOSIS — R008 Other abnormalities of heart beat: Secondary | ICD-10-CM | POA: Diagnosis not present

## 2021-02-16 DIAGNOSIS — E7849 Other hyperlipidemia: Secondary | ICD-10-CM | POA: Diagnosis not present

## 2021-02-16 DIAGNOSIS — E039 Hypothyroidism, unspecified: Secondary | ICD-10-CM | POA: Diagnosis not present

## 2021-02-17 ENCOUNTER — Telehealth: Payer: Self-pay

## 2021-02-17 NOTE — Telephone Encounter (Signed)
Notes on file.

## 2021-02-23 ENCOUNTER — Other Ambulatory Visit: Payer: Self-pay

## 2021-02-23 ENCOUNTER — Encounter: Payer: Self-pay | Admitting: Cardiovascular Disease

## 2021-02-23 ENCOUNTER — Ambulatory Visit: Payer: Medicare Other | Admitting: Cardiovascular Disease

## 2021-02-23 ENCOUNTER — Ambulatory Visit (INDEPENDENT_AMBULATORY_CARE_PROVIDER_SITE_OTHER): Payer: Medicare Other

## 2021-02-23 DIAGNOSIS — R002 Palpitations: Secondary | ICD-10-CM | POA: Diagnosis not present

## 2021-02-23 DIAGNOSIS — I10 Essential (primary) hypertension: Secondary | ICD-10-CM | POA: Diagnosis not present

## 2021-02-23 DIAGNOSIS — E782 Mixed hyperlipidemia: Secondary | ICD-10-CM | POA: Diagnosis not present

## 2021-02-23 DIAGNOSIS — E785 Hyperlipidemia, unspecified: Secondary | ICD-10-CM | POA: Insufficient documentation

## 2021-02-23 DIAGNOSIS — R072 Precordial pain: Secondary | ICD-10-CM

## 2021-02-23 HISTORY — DX: Palpitations: R00.2

## 2021-02-23 MED ORDER — METOPROLOL TARTRATE 50 MG PO TABS: 50.0000 mg | ORAL_TABLET | Freq: Once | ORAL | 0 refills | Status: DC

## 2021-02-23 NOTE — Progress Notes (Signed)
02/23/2021 Anna Gibson   1945/10/25  [id number]  Primary Physician Anna Sites, MD Primary Cardiologist: Lorretta Harp MD Anna Gibson, [place]  HPI:  Anna Gibson is a 75 y.o. moderately overweight married Caucasian female mother of 2, grandmother of 1 grandchild who is accompanied by her husband Anna Gibson today.  She is referred back by Dr. Hilma Gibson for evaluation of atypical chest pain and palpitations.  Risk factors include treated hypertension, untreated hyperlipidemia because of statin intolerance and family history with a brother who has had stents.  She did have a cardiac catheterization performed because of chest pain by myself 09/29/2011 which was entirely normal.  She gets chest pain on a daily basis as well as palpitations.  She does admit to being under a lot of stress.   Current Meds  Medication Sig  . acetaminophen (TYLENOL) 650 MG CR tablet Take 650-1,300 mg by mouth daily as needed for pain.   Marland Kitchen ALPRAZolam (XANAX) 1 MG tablet Take 1 mg by mouth at bedtime.  . Biotin 2500 MCG CAPS Take 1 capsule by mouth daily. Hair,skin,nails and collegen  . bisoprolol-hydrochlorothiazide (ZIAC) 5-6.25 MG tablet Take 1 tablet by mouth daily.  . Cholecalciferol (VITAMIN D-3) 125 MCG (5000 UT) TABS Take 5,000 Units by mouth daily.  . famotidine (PEPCID) 20 MG tablet Take 20 mg by mouth daily as needed for heartburn. Takes 6 days out of the week.  . Magnesium 400 MG TABS Take 400 mg by mouth daily.   . Multiple Vitamins-Minerals (WOMENS 50+ MULTI VITAMIN/MIN PO) Take 1 tablet by mouth daily.  . pantoprazole (PROTONIX) 40 MG tablet 1 PO 30 MINS PRIOR TO YOUR FIRST MEAL  . Probiotic Product (PROBIOTIC DAILY PO) Take 1 tablet by mouth daily.  . psyllium (METAMUCIL) 58.6 % powder Take 1 packet by mouth daily in the afternoon. SUGAR FREE-ORANGE FLAVORED  . [DISCONTINUED] hydrocortisone (ANUSOL-HC) 2.5 % rectal cream Place 1 application rectally 2 (two) times daily. (Patient taking  differently: Place 1 application rectally daily as needed for hemorrhoids.)  . [DISCONTINUED] lidocaine (XYLOCAINE) 5 % ointment Apply 1 application topically 2 (two) times daily as needed. (Patient taking differently: Apply 1 application topically 2 (two) times daily as needed for mild pain.)  . [DISCONTINUED] ondansetron (ZOFRAN) 4 MG tablet Take 1 tablet (4 mg total) by mouth every 8 (eight) hours as needed for nausea or vomiting.     Allergies  Allergen Reactions  . Indomethacin Other (See Comments)    "sever headache"    Social History   Socioeconomic History  . Marital status: Married    Spouse name: Not on file  . Number of children: Not on file  . Years of education: Not on file  . Highest education level: Not on file  Occupational History  . Not on file  Tobacco Use  . Smoking status: Never Smoker  . Smokeless tobacco: Never Used  Vaping Use  . Vaping Use: Never used  Substance and Sexual Activity  . Alcohol use: No  . Drug use: No  . Sexual activity: Yes  Other Topics Concern  . Not on file  Social History Narrative  . Not on file   Social Determinants of Health   Financial Resource Strain: Not on file  Food Insecurity: Not on file  Transportation Needs: Not on file  Physical Activity: Not on file  Stress: Not on file  Social Connections: Not on file  Intimate Partner Violence: Not on file  Review of Systems: General: negative for chills, fever, night sweats or weight changes.  Cardiovascular: negative for chest pain, dyspnea on exertion, edema, orthopnea, palpitations, paroxysmal nocturnal dyspnea or shortness of breath Dermatological: negative for rash Respiratory: negative for cough or wheezing Urologic: negative for hematuria Abdominal: negative for nausea, vomiting, diarrhea, bright red blood per rectum, melena, or hematemesis Neurologic: negative for visual changes, syncope, or dizziness All other systems reviewed and are otherwise negative  except as noted above.    Blood pressure (!) 168/88, pulse 66, height 5\' 4"  (1.626 m), weight 170 lb 9.6 oz (77.4 kg), SpO2 98 %.  General appearance: alert and no distress Neck: no adenopathy, no carotid bruit, no JVD, supple, symmetrical, trachea midline and thyroid not enlarged, symmetric, no tenderness/mass/nodules Lungs: clear to auscultation bilaterally Heart: regular rate and rhythm, S1, S2 normal, no murmur, click, rub or gallop Extremities: extremities normal, atraumatic, no cyanosis or edema Pulses: 2+ and symmetric Skin: Skin color, texture, turgor normal. No rashes or lesions Neurologic: Alert and oriented X 3, normal strength and tone. Normal symmetric reflexes. Normal coordination and gait  EKG sinus rhythm at 66 with nonspecific ST and T wave changes.  I personally reviewed this EKG.  ASSESSMENT AND PLAN:   HTN (hypertension), though recently low BP History of essential hypertension a blood pressure measured today at 168/88.  She is on Ziac.  She takes her blood pressure at home and it is usually within normal range.  Hyperlipidemia History of hyperlipidemia intolerant to statin therapy with lipid profile performed 09/24/2020 revealing total cholesterol 269, LDL of 178 and HDL of 68.  Palpitations Patient complains of daily palpitations.  She does admit to being under a lot of stress.  She drinks 1 cup of decaf coffee a day.  Her TSH is normal.  I am going to check a 2-week Zio patch to further evaluate.  Chest pain, increasing over several days.  Patent coronary arteries by cardiac cath 09/29/11 History of atypical chest pain in the past with cardiac catheterization performed by myself 09/29/2011 revealing entirely normal coronary arteries.      Lorretta Harp MD FACP,FACC,FAHA, [facility] 02/23/2021 1:54 PM

## 2021-02-23 NOTE — Assessment & Plan Note (Signed)
History of hyperlipidemia intolerant to statin therapy with lipid profile performed 09/24/2020 revealing total cholesterol 269, LDL of 178 and HDL of 68.

## 2021-02-23 NOTE — Assessment & Plan Note (Signed)
History of atypical chest pain in the past with cardiac catheterization performed by myself 09/29/2011 revealing entirely normal coronary arteries.

## 2021-02-23 NOTE — Assessment & Plan Note (Signed)
History of essential hypertension a blood pressure measured today at 168/88.  She is on Ziac.  She takes her blood pressure at home and it is usually within normal range.

## 2021-02-23 NOTE — Assessment & Plan Note (Signed)
Patient complains of daily palpitations.  She does admit to being under a lot of stress.  She drinks 1 cup of decaf coffee a day.  Her TSH is normal.  I am going to check a 2-week Zio patch to further evaluate.

## 2021-02-23 NOTE — Patient Instructions (Signed)
Medication Instructions:  Your physician recommends that you continue on your current medications as directed. Please refer to the Current Medication list given to you today.  *If you need a refill on your cardiac medications before your next appointment, please call your pharmacy*   Lab Work: Your physician recommends that you return for lab work in: no more that 7 days prior to coronary CTA.   If you have labs (blood work) drawn today and your tests are completely normal, you will receive your results only by: Marland Kitchen MyChart Message (if you have MyChart) OR . A paper copy in the mail If you have any lab test that is abnormal or we need to change your treatment, we will call you to review the results.   Testing/Procedures: Your cardiac CT will be scheduled at one of the below locations:   [facility] [address] [place], [place] [address] [phone]  If scheduled at [facility], please arrive at the [facility] main entrance (entrance A) of [facility] 30 minutes prior to test start time. Proceed to the [facility] Radiology Department (first floor) to check-in and test prep.  Please follow these instructions carefully (unless otherwise directed):  On the Night Before the Test: . Be sure to Drink plenty of water. . Do not consume any caffeinated/decaffeinated beverages or chocolate 12 hours prior to your test. . Do not take any antihistamines 12 hours prior to your test.   On the Day of the Test: . Drink plenty of water until 1 hour prior to the test. . Do not eat any food 4 hours prior to the test. . You may take your regular medications prior to the test.  . Take metoprolol (Lopressor) 50mg  two hours prior to test. . HOLD Furosemide/Hydrochlorothiazide morning of the test. . FEMALES- please wear underwire-free bra if available       After the Test: . Drink plenty of water. . After receiving IV contrast, you may experience a mild flushed  feeling. This is normal. . On occasion, you may experience a mild rash up to 24 hours after the test. This is not dangerous. If this occurs, you can take Benadryl 25 mg and increase your fluid intake. . If you experience trouble breathing, this can be serious. If it is severe call 911 IMMEDIATELY. If it is mild, please call our office. . If you take any of these medications: Glipizide/Metformin, Avandament, Glucavance, please do not take 48 hours after completing test unless otherwise instructed.   Once we have confirmed authorization from your insurance company, we will call you to set up a date and time for your test. Based on how quickly your insurance processes prior authorizations requests, please allow up to 4 weeks to be contacted for scheduling your Cardiac CT appointment. Be advised that routine Cardiac CT appointments could be scheduled as many as 8 weeks after your provider has ordered it.  For non-scheduling related questions, please contact the cardiac imaging nurse navigator should you have any questions/concerns: Marchia Bond, Cardiac Imaging Nurse Navigator Gordy Clement, Cardiac Imaging Nurse Navigator [facility] Direct Office Dial: [phone]   For scheduling needs, including cancellations and rescheduling, please call [place], [phone].  ZIO XT- Long Term Monitor Instructions  Your physician has requested you wear a ZIO patch monitor for 14 days.   This is a single patch monitor. [employer] supplies one patch monitor per enrollment. Additional stickers are not available. Please do not apply  patch if you will be having a Nuclear Stress Test,  Echocardiogram, Cardiac CT, MRI, or Chest Xray during the period you would be wearing the  monitor. The patch cannot be worn during these tests. You cannot remove and re-apply the  ZIO XT patch monitor.  Your ZIO patch monitor will be mailed 3 day USPS to your address on file. It may take 3-5 days  to  receive your monitor after you have been enrolled.  Once you have received your monitor, please review the enclosed instructions. Your monitor  has already been registered assigning a specific monitor serial # to you.  Billing and Patient Assistance Program Information  We have supplied [employer] with any of your insurance information on file for billing purposes. [employer] offers a sliding scale Patient Assistance Program for patients that do not have  insurance, or whose insurance does not completely cover the cost of the ZIO monitor.  You must apply for the Patient Assistance Program to qualify for this discounted rate.  To apply, please call [employer] at [phone], select option 4, select option 2, ask to apply for  Patient Assistance Program. Theodore Demark will ask your household income, and how many people  are in your household. They will quote your out-of-pocket cost based on that information.  [employer] will also be able to set up a 33-month, interest-free payment plan if needed.  Applying the monitor  Shave hair from upper left chest.  Hold abrader disc by orange tab. Rub abrader in 40 strokes over the upper left chest as  indicated in your monitor instructions.  Clean area with 4 enclosed alcohol pads. Let dry.  Apply patch as indicated in monitor instructions. Patch will be placed under collarbone on left  side of chest with arrow pointing upward.  Rub patch adhesive wings for 2 minutes. Remove white label marked "1". Remove the white  label marked "2". Rub patch adhesive wings for 2 additional minutes.  While looking in a mirror, press and release button in center of patch. A small green light will  flash 3-4 times. This will be your only indicator that the monitor has been turned on.   Do not shower for the first 24 hours. You may shower after the first 24 hours.   Press the button if you feel a symptom. You will hear a small click. Record Date, Time and  Symptom in the Patient  Logbook.  When you are ready to remove the patch, follow instructions on the last 2 pages of Patient  Logbook. Stick patch monitor onto the last page of Patient Logbook.  Place Patient Logbook in the blue and white box. Use locking tab on box and tape box closed  securely. The blue and white box has prepaid postage on it. Please place it in the mailbox as  soon as possible. Your physician should have your test results approximately 7 days after the  monitor has been mailed back to [facility].  Call [place] at [phone] if you have questions regarding  your ZIO XT patch monitor. Call them immediately if you see an orange light blinking on your  monitor.  If your monitor falls off in less than 4 days, contact our Monitor department at [phone].  If your monitor becomes loose or falls off after 4 days call [employer] at [phone] for  suggestions on securing your monitor   Follow-Up: At [facility], you and your health needs are our priority.  As part of our continuing mission  to provide you with exceptional heart care, we have created designated Provider Care Teams.  These Care Teams include your primary Cardiologist (physician) and Advanced Practice Providers (APPs -  Physician Assistants and Nurse Practitioners) who all work together to provide you with the care you need, when you need it.  We recommend signing up for the patient portal called "MyChart".  Sign up information is provided on this After Visit Summary.  MyChart is used to connect with patients for Virtual Visits (Telemedicine).  Patients are able to view lab/test results, encounter notes, upcoming appointments, etc.  Non-urgent messages can be sent to your provider as well.   To learn more about what you can do with MyChart, go to [email or web address].    Your next appointment:   3 month(s)  The format for your next appointment:   In Person  Provider:   Quay Burow, MD

## 2021-02-23 NOTE — Progress Notes (Unsigned)
Patient enrolled for [employer] to mail a 14 day ZIO XT monitor to address on record.

## 2021-02-25 ENCOUNTER — Ambulatory Visit
Admission: RE | Admit: 2021-02-25 | Discharge: 2021-02-25 | Disposition: A | Discharge status: Home / Self Care | Payer: Medicare Other | Source: Ambulatory Visit | Attending: Neurosurgery | Admitting: Neurosurgery

## 2021-02-25 ENCOUNTER — Other Ambulatory Visit: Payer: Self-pay

## 2021-02-25 DIAGNOSIS — M4316 Spondylolisthesis, lumbar region: Secondary | ICD-10-CM

## 2021-02-25 DIAGNOSIS — M545 Low back pain, unspecified: Secondary | ICD-10-CM | POA: Diagnosis not present

## 2021-02-26 DIAGNOSIS — R002 Palpitations: Secondary | ICD-10-CM | POA: Diagnosis not present

## 2021-03-08 DIAGNOSIS — M4316 Spondylolisthesis, lumbar region: Secondary | ICD-10-CM | POA: Diagnosis not present

## 2021-03-08 DIAGNOSIS — M5416 Radiculopathy, lumbar region: Secondary | ICD-10-CM | POA: Diagnosis not present

## 2021-03-17 DIAGNOSIS — M5416 Radiculopathy, lumbar region: Secondary | ICD-10-CM | POA: Diagnosis not present

## 2021-03-18 DIAGNOSIS — R002 Palpitations: Secondary | ICD-10-CM | POA: Diagnosis not present

## 2021-03-19 DIAGNOSIS — E782 Mixed hyperlipidemia: Secondary | ICD-10-CM | POA: Diagnosis not present

## 2021-03-19 DIAGNOSIS — R072 Precordial pain: Secondary | ICD-10-CM | POA: Diagnosis not present

## 2021-03-19 DIAGNOSIS — I10 Essential (primary) hypertension: Secondary | ICD-10-CM | POA: Diagnosis not present

## 2021-03-19 DIAGNOSIS — R002 Palpitations: Secondary | ICD-10-CM | POA: Diagnosis not present

## 2021-03-19 LAB — BASIC METABOLIC PANEL
BUN/Creatinine Ratio: 23 (ref 12–28)
BUN: 21 mg/dL (ref 8–27)
CO2: 23 mmol/L (ref 20–29)
Calcium: 9.9 mg/dL (ref 8.7–10.3)
Chloride: 104 mmol/L (ref 96–106)
Creatinine, Ser: 0.92 mg/dL (ref 0.57–1.00)
Glucose: 86 mg/dL (ref 65–99)
Potassium: 3.8 mmol/L (ref 3.5–5.2)
Sodium: 142 mmol/L (ref 134–144)
eGFR: 66 mL/min/{1.73_m2} (ref >59)

## 2021-03-25 ENCOUNTER — Telehealth (HOSPITAL_COMMUNITY): Payer: Self-pay | Admitting: Emergency Medicine

## 2021-03-25 NOTE — Telephone Encounter (Signed)
Reaching out to patient to offer assistance regarding upcoming cardiac imaging study; pt verbalizes understanding of appt date/time, parking situation and where to check in, pre-test NPO status and medications ordered, and verified current allergies; name and call back number provided for further questions should they arise Marchia Bond RN Navigator Cardiac Imaging [facility] [phone] office [phone] cell  50mg  metoprolol tartrate  Denies claustro Denies IV issues

## 2021-03-26 ENCOUNTER — Ambulatory Visit (HOSPITAL_COMMUNITY)
Admission: RE | Admit: 2021-03-26 | Discharge: 2021-03-26 | Disposition: A | Discharge status: Home / Self Care | Payer: Medicare Other | Source: Ambulatory Visit | Attending: Cardiovascular Disease | Admitting: Cardiovascular Disease

## 2021-03-26 ENCOUNTER — Other Ambulatory Visit: Payer: Self-pay

## 2021-03-26 DIAGNOSIS — R072 Precordial pain: Secondary | ICD-10-CM | POA: Diagnosis not present

## 2021-03-26 DIAGNOSIS — I700 Atherosclerosis of aorta: Secondary | ICD-10-CM | POA: Diagnosis not present

## 2021-03-26 MED ORDER — NITROGLYCERIN 0.4 MG SL SUBL
SUBLINGUAL_TABLET | SUBLINGUAL | Status: AC
  Administered 2021-03-26: 0.8 mg
  Filled 2021-03-26: qty 2

## 2021-03-26 MED ORDER — IOHEXOL 350 MG/ML SOLN
95.0000 mL | Freq: Once | INTRAVENOUS | Status: AC | PRN
  Administered 2021-03-26: 95 mL via INTRAVENOUS

## 2021-03-26 MED ORDER — NITROGLYCERIN 0.4 MG SL SUBL: 0.8000 mg | SUBLINGUAL_TABLET | Freq: Once | SUBLINGUAL | Status: DC

## 2021-04-05 ENCOUNTER — Telehealth: Payer: Self-pay | Admitting: Cardiovascular Disease

## 2021-04-05 DIAGNOSIS — E782 Mixed hyperlipidemia: Secondary | ICD-10-CM

## 2021-04-05 NOTE — Addendum Note (Signed)
Addended by: Fidel Levy on: 04/05/2021 12:46 PM   Modules accepted: Orders

## 2021-04-05 NOTE — Telephone Encounter (Signed)
Patient is returning call to discuss CT results. 

## 2021-04-05 NOTE — Telephone Encounter (Signed)
Spoke with patient regarding CT results and referral lipid clinic referral. She is agreeable with this. Scheduled for visit with clinical pharmacy team

## 2021-04-05 NOTE — Telephone Encounter (Signed)
Left message for patient to call back to discuss CT result recommendations regarding lipid management (as copied below)   Lorretta Harp, MD  Fidel Levy, RN Given elevated CCS and LDL, statin intol, refer to Pharm D to discuss Repatha   JJB

## 2021-04-06 ENCOUNTER — Telehealth: Payer: Self-pay

## 2021-04-06 ENCOUNTER — Other Ambulatory Visit: Payer: Self-pay

## 2021-04-06 DIAGNOSIS — E782 Mixed hyperlipidemia: Secondary | ICD-10-CM

## 2021-04-06 NOTE — Telephone Encounter (Signed)
LIPID LABS NEEDED PRIOR TO APPT

## 2021-04-13 DIAGNOSIS — E782 Mixed hyperlipidemia: Secondary | ICD-10-CM | POA: Diagnosis not present

## 2021-04-14 LAB — LIPID PANEL
Chol/HDL Ratio: 4.2 ratio (ref 0.0–4.4)
Cholesterol, Total: 242 mg/dL — ABNORMAL HIGH (ref 100–199)
HDL: 58 mg/dL (ref >39)
LDL Chol Calc (NIH): 147 mg/dL — ABNORMAL HIGH (ref 0–99)
Triglycerides: 207 mg/dL — ABNORMAL HIGH (ref 0–149)
VLDL Cholesterol Cal: 37 mg/dL (ref 5–40)

## 2021-04-15 DIAGNOSIS — M4316 Spondylolisthesis, lumbar region: Secondary | ICD-10-CM | POA: Diagnosis not present

## 2021-04-15 DIAGNOSIS — R030 Elevated blood-pressure reading, without diagnosis of hypertension: Secondary | ICD-10-CM | POA: Diagnosis not present

## 2021-04-15 DIAGNOSIS — M5416 Radiculopathy, lumbar region: Secondary | ICD-10-CM | POA: Diagnosis not present

## 2021-04-22 ENCOUNTER — Other Ambulatory Visit: Payer: Self-pay

## 2021-04-22 ENCOUNTER — Encounter: Payer: Self-pay | Admitting: Pharmacist

## 2021-04-22 ENCOUNTER — Ambulatory Visit (INDEPENDENT_AMBULATORY_CARE_PROVIDER_SITE_OTHER): Payer: Medicare Other | Admitting: Pharmacist

## 2021-04-22 VITALS — BP 170/94 | HR 66 | Resp 15 | Ht 64.0 in | Wt 173.4 lb

## 2021-04-22 DIAGNOSIS — M791 Myalgia, unspecified site: Secondary | ICD-10-CM

## 2021-04-22 DIAGNOSIS — T466X5A Adverse effect of antihyperlipidemic and antiarteriosclerotic drugs, initial encounter: Secondary | ICD-10-CM

## 2021-04-22 DIAGNOSIS — E782 Mixed hyperlipidemia: Secondary | ICD-10-CM

## 2021-04-22 NOTE — Patient Instructions (Signed)
It was nice meeting you today!  We would like your LDL to be less than 70  Slowly increase exercise again up to 5 days a week for at last 30 minutes at a time  Concentrate on vegetables, lean proteins, healthy fats, and whole grains.  Try to cut down on the fried and fatty foods  We will start a new medication that you will inject once every 2 weeks  We will call you when the prior authorization is approved  We will then recheck your cholesterol level in about 2-3 months  Karren Cobble, PharmD, BCACP, [place], [place] [id number], [place], [place] [address] Phone: [phone]; Fax: [phone] 04/22/2021 2:12 PM

## 2021-04-22 NOTE — Progress Notes (Signed)
Patient ID: [name]                 DOB: [birth year]                    MRN: [id number]     HPI: Anna Gibson is a 75 y.o. female patient referred to lipid clinic by Dr Gwenlyn Found. PMH is significant for HTN, chest pain, HLD, and intolerance to statins.    Patient presents today with husband. Couple live in [place] and eat out frequently.  Eats many hamburgers, hot dogs, chicken (grilled and fried) and fish (grilled and fried).  Keeps chickens at home but has not been eating the eggs.    Was previously very physcially active until she developed back and muscle pain.  Received steroid injection and is now recovering. Is looking forward to walking again.  Intolerant to statins as all caused muscle pain.  Reports could not get out of bed or walk.    Current Medications: Krill oil (does not know strength) Intolerances: rosuvastatin, atorvastatin Risk Factors: Elevated coronary calcium score, HTN LDL goal: <70  Labs:Coronary calcium score: The patient's coronary artery calcium score is 184, which places the patient in the 74th percentile.  LDL 147, TC 242, HDL 58, Trigs 207 (04/13/21 - not on any lipid lowering therapy)  Past Medical History:  Diagnosis Date   Burning sensation of mouth 08/13/2014   GERD (gastroesophageal reflux disease)    HTN (hypertension), though recently low BP 09/29/2011   Hypertension    Vitamin D deficiency     Current Outpatient Medications on File Prior to Visit  Medication Sig Dispense Refill   acetaminophen (TYLENOL) 650 MG CR tablet Take 650-1,300 mg by mouth daily as needed for pain.      ALPRAZolam (XANAX) 1 MG tablet Take 1 mg by mouth at bedtime.     Biotin 2500 MCG CAPS Take 1 capsule by mouth daily. Hair,skin,nails and collegen     bisoprolol-hydrochlorothiazide (ZIAC) 5-6.25 MG tablet Take 1 tablet by mouth daily.     Cholecalciferol (VITAMIN D-3) 125 MCG (5000 UT) TABS Take 5,000 Units by mouth daily.     famotidine (PEPCID) 20 MG tablet Take 20 mg  by mouth daily as needed for heartburn. Takes 6 days out of the week.     Magnesium 400 MG TABS Take 400 mg by mouth daily.      metoprolol tartrate (LOPRESSOR) 50 MG tablet Take 1 tablet (50 mg total) by mouth once for 1 dose. Take 2 hours prior to procedure. 1 tablet 0   Multiple Vitamins-Minerals (WOMENS 50+ MULTI VITAMIN/MIN PO) Take 1 tablet by mouth daily.     pantoprazole (PROTONIX) 40 MG tablet 1 PO 30 MINS PRIOR TO YOUR FIRST MEAL 90 tablet 3   Probiotic Product (PROBIOTIC DAILY PO) Take 1 tablet by mouth daily.     psyllium (METAMUCIL) 58.6 % powder Take 1 packet by mouth daily in the afternoon. SUGAR FREE-ORANGE FLAVORED     No current facility-administered medications on file prior to visit.    Allergies  Allergen Reactions   Indomethacin Other (See Comments)    "sever headache"    Assessment/Plan:  1. Hyperlipidemia - Patient current LDL 147 which is above goal of <70.  Counseled patient to work on improving diet and avoiding sugars and processed foods.  Since patient frequently eats out, recommended ordering foods such as vegetables, lean proteins, and whole grains.  Recommended slowly increasing exercise regimen since  she reports she felt much better when she was able to walk. Declines Vascepa or other triglyceride lowering therapy at this time because she feels that with exercise she will be able to bring numbers down.  Patient needs further LDL lowering.  Recommended PCSK9i therapy.  Using demo pen, educated patient on mechanism of action, storage, site selection, and administration.  Patient voiced understanding.  Will complete prior authorization and call patient when approved.  Will recheck lipids in 2-3 months.  Consider Vascepa at that time.  Karren Cobble, PharmD, BCACP, [place], [place] [id number] [place], [place] [address] Phone: [phone]; Fax: [phone] 04/22/2021 5:39 PM

## 2021-04-23 ENCOUNTER — Telehealth: Payer: Self-pay | Admitting: Pharmacist

## 2021-04-23 DIAGNOSIS — E782 Mixed hyperlipidemia: Secondary | ICD-10-CM

## 2021-04-23 MED ORDER — REPATHA SURECLICK 140 MG/ML ~~LOC~~ SOAJ: 1.0000 mL | SUBCUTANEOUS | 11 refills | Status: DC

## 2021-04-23 NOTE — Telephone Encounter (Signed)
Spoke with husband.  PA for Repatha approved through 10/23/21

## 2021-04-28 DIAGNOSIS — D123 Benign neoplasm of transverse colon: Secondary | ICD-10-CM | POA: Diagnosis not present

## 2021-04-28 DIAGNOSIS — Z1211 Encounter for screening for malignant neoplasm of colon: Secondary | ICD-10-CM | POA: Diagnosis not present

## 2021-04-28 DIAGNOSIS — K2930 Chronic superficial gastritis without bleeding: Secondary | ICD-10-CM | POA: Diagnosis not present

## 2021-04-28 DIAGNOSIS — K317 Polyp of stomach and duodenum: Secondary | ICD-10-CM | POA: Diagnosis not present

## 2021-04-28 DIAGNOSIS — K449 Diaphragmatic hernia without obstruction or gangrene: Secondary | ICD-10-CM | POA: Diagnosis not present

## 2021-04-28 DIAGNOSIS — K3189 Other diseases of stomach and duodenum: Secondary | ICD-10-CM | POA: Diagnosis not present

## 2021-04-28 DIAGNOSIS — K648 Other hemorrhoids: Secondary | ICD-10-CM | POA: Diagnosis not present

## 2021-04-28 DIAGNOSIS — K644 Residual hemorrhoidal skin tags: Secondary | ICD-10-CM | POA: Diagnosis not present

## 2021-04-28 DIAGNOSIS — K5730 Diverticulosis of large intestine without perforation or abscess without bleeding: Secondary | ICD-10-CM | POA: Diagnosis not present

## 2021-05-04 DIAGNOSIS — K2930 Chronic superficial gastritis without bleeding: Secondary | ICD-10-CM | POA: Diagnosis not present

## 2021-05-04 DIAGNOSIS — K317 Polyp of stomach and duodenum: Secondary | ICD-10-CM | POA: Diagnosis not present

## 2021-05-04 DIAGNOSIS — D123 Benign neoplasm of transverse colon: Secondary | ICD-10-CM | POA: Diagnosis not present

## 2021-07-08 ENCOUNTER — Telehealth: Payer: Self-pay | Admitting: Cardiovascular Disease

## 2021-07-08 DIAGNOSIS — M5416 Radiculopathy, lumbar region: Secondary | ICD-10-CM | POA: Diagnosis not present

## 2021-07-08 DIAGNOSIS — E782 Mixed hyperlipidemia: Secondary | ICD-10-CM

## 2021-07-08 NOTE — Telephone Encounter (Signed)
Called patient's husband (DPR) back about message. Informed him that patient can go to [facility] in [place], that her lipid profile order was released and they should be able to see order. Patient's husband also stated that patient is seeing her PCP next week and they will be getting lab work. Informed him if PCP gets a lipid panel to just have them fax [place] a copy of results. Will forward to pharmacist who ordered lab work.

## 2021-07-08 NOTE — Telephone Encounter (Signed)
New Message:       Pt needs her lab order mail to her please. She wants to have lab work at [facility] in [place]

## 2021-07-15 DIAGNOSIS — E7849 Other hyperlipidemia: Secondary | ICD-10-CM | POA: Diagnosis not present

## 2021-07-15 DIAGNOSIS — I10 Essential (primary) hypertension: Secondary | ICD-10-CM | POA: Diagnosis not present

## 2021-07-15 DIAGNOSIS — E782 Mixed hyperlipidemia: Secondary | ICD-10-CM | POA: Diagnosis not present

## 2021-07-15 DIAGNOSIS — R7309 Other abnormal glucose: Secondary | ICD-10-CM | POA: Diagnosis not present

## 2021-07-15 DIAGNOSIS — E039 Hypothyroidism, unspecified: Secondary | ICD-10-CM | POA: Diagnosis not present

## 2021-07-15 DIAGNOSIS — Z23 Encounter for immunization: Secondary | ICD-10-CM | POA: Diagnosis not present

## 2021-07-28 ENCOUNTER — Ambulatory Visit: Payer: Medicare Other | Admitting: Cardiovascular Disease

## 2021-07-28 ENCOUNTER — Other Ambulatory Visit: Payer: Self-pay

## 2021-07-28 ENCOUNTER — Encounter: Payer: Self-pay | Admitting: Cardiovascular Disease

## 2021-07-28 DIAGNOSIS — I10 Essential (primary) hypertension: Secondary | ICD-10-CM

## 2021-07-28 DIAGNOSIS — R002 Palpitations: Secondary | ICD-10-CM | POA: Diagnosis not present

## 2021-07-28 DIAGNOSIS — E782 Mixed hyperlipidemia: Secondary | ICD-10-CM | POA: Diagnosis not present

## 2021-07-28 NOTE — Assessment & Plan Note (Signed)
History of essential hypertension a blood pressure measured today at 140/90.  She is on Ziac.  She says that her blood pressure at home usually runs in the 130/80 range.

## 2021-07-28 NOTE — Assessment & Plan Note (Signed)
History of palpitations which have improved since I saw her 3 months ago.  She did have an event monitor that showed frequent PVCs, PACs, short runs of SVT and nonsustained ventricular tachycardia.  No further evaluation is required at this time given the improvement in her symptoms.

## 2021-07-28 NOTE — Progress Notes (Addendum)
08/17/2021 Anna Gibson   09-17-1946  [id number]  Primary Physician Sharilyn Sites, MD Primary Cardiologist: Lorretta Harp MD Lupe Carney, [place]  HPI:  Anna Gibson is a 75 y.o.  moderately overweight married Caucasian female mother of 2, grandmother of 1 grandchild who is accompanied by her husband [name] today.  She is referred back by Dr. Hilma Favors for evaluation of atypical chest pain and palpitations.  I last saw her in the office 02/23/2021.  Risk factors include treated hypertension, untreated hyperlipidemia because of statin intolerance and family history with a brother who has had stents.  She did have a cardiac catheterization performed because of chest pain by myself 09/29/2011 which was entirely normal.  She gets chest pain on a daily basis as well as palpitations.  She does admit to being under a lot of stress.  Since I saw her 4 months ago I did get an event monitor that showed frequent PACs, PVCs, short runs of SVT and nonsustained ventricular tachycardia although her palpitations have improved significantly since I saw her.  Likewise, her chest pain has improved as well.  I did get a coronary CTA that showed a coronary calcium score of 184 with minimal/nonobstructive CAD.  She was placed on Repatha which has caused some discomfort in her lower extremities.  Current Meds  Medication Sig   acetaminophen (TYLENOL) 650 MG CR tablet Take 650-1,300 mg by mouth daily as needed for pain.    Aloe Vera GEL Apply topically.   ALPRAZolam (XANAX) 1 MG tablet Take 1 mg by mouth at bedtime.   Biotin 2500 MCG CAPS Take 1 capsule by mouth daily. Hair,skin,nails and collegen   bisoprolol-hydrochlorothiazide (ZIAC) 5-6.25 MG tablet Take 1 tablet by mouth daily.   Cholecalciferol (VITAMIN D-3) 125 MCG (5000 UT) TABS Take 5,000 Units by mouth daily.   Evolocumab (REPATHA SURECLICK) 102 MG/ML SOAJ Inject 1 mL into the skin every 14 (fourteen) days.   famotidine (PEPCID) 20 MG tablet Take  20 mg by mouth daily as needed for heartburn. Takes 6 days out of the week.   KRILL OIL PO Take by mouth.   Magnesium 400 MG TABS Take 400 mg by mouth daily.    Multiple Vitamins-Minerals (WOMENS 50+ MULTI VITAMIN/MIN PO) Take 1 tablet by mouth daily.   pantoprazole (PROTONIX) 40 MG tablet 1 PO 30 MINS PRIOR TO YOUR FIRST MEAL   Probiotic Product (PROBIOTIC DAILY PO) Take 1 tablet by mouth daily.   psyllium (METAMUCIL) 58.6 % powder Take 1 packet by mouth daily in the afternoon. SUGAR FREE-ORANGE FLAVORED   RUTIN PO Take by mouth.     Allergies  Allergen Reactions   Crestor [Rosuvastatin]     Sick and myalgias   Gabapentin     Dizzy spacy    Indomethacin Other (See Comments)    "sever headache"   Lipitor [Atorvastatin]     myalgias    Social History   Socioeconomic History   Marital status: Married    Spouse name: Not on file   Number of children: Not on file   Years of education: Not on file   Highest education level: Not on file  Occupational History   Not on file  Tobacco Use   Smoking status: Never   Smokeless tobacco: Never  Vaping Use   Vaping Use: Never used  Substance and Sexual Activity   Alcohol use: No   Drug use: No   Sexual activity: Yes  Other Topics  Concern   Not on file  Social History Narrative   Not on file   Social Determinants of Health   Financial Resource Strain: Not on file  Food Insecurity: Not on file  Transportation Needs: Not on file  Physical Activity: Not on file  Stress: Not on file  Social Connections: Not on file  Intimate Partner Violence: Not on file     Review of Systems: General: negative for chills, fever, night sweats or weight changes.  Cardiovascular: negative for chest pain, dyspnea on exertion, edema, orthopnea, palpitations, paroxysmal nocturnal dyspnea or shortness of breath Dermatological: negative for rash Respiratory: negative for cough or wheezing Urologic: negative for hematuria Abdominal: negative for  nausea, vomiting, diarrhea, bright red blood per rectum, melena, or hematemesis Neurologic: negative for visual changes, syncope, or dizziness All other systems reviewed and are otherwise negative except as noted above.    Blood pressure 140/90, pulse 62, height 5\' 4"  (1.626 m), weight 168 lb 6.4 oz (76.4 kg), SpO2 98 %.  General appearance: alert and no distress Neck: no adenopathy, no carotid bruit, no JVD, supple, symmetrical, trachea midline, and thyroid not enlarged, symmetric, no tenderness/mass/nodules Lungs: clear to auscultation bilaterally Heart: regular rate and rhythm, S1, S2 normal, no murmur, click, rub or gallop Extremities: extremities normal, atraumatic, no cyanosis or edema Pulses: 2+ and symmetric Skin: Skin color, texture, turgor normal. No rashes or lesions Neurologic: Grossly normal  EKG normal sinus rhythm at 62 without ST or T wave changes.  Personally reviewed this EKG.  ASSESSMENT AND PLAN:   HTN (hypertension), though recently low BP History of essential hypertension a blood pressure measured today at 140/90.  She is on Ziac.  She says that her blood pressure at home usually runs in the 130/80 range.  Chest pain, increasing over several days.  Patent coronary arteries by cardiac cath 09/29/11 History of atypical chest pain when I saw her 3 months ago which has since resolved.  She did have a normal cath which I performed 09/29/2011, coronary CTA performed 03/26/2021 revealed a coronary calcium score of 184 with minimal nonobstructive disease.  Hyperlipidemia History of hyperlipidemia intolerant to statin therapy.  She was recently begun on Repatha approxi-3 months ago however she does have some symptoms in her lower extremities for several days after each injection.  I am going to refer her to Dr. Debara Pickett for further evaluation and treatment.  Given her elevated coronary calcium score, I would prefer her LDL to be less than 70.  Her recent lipid profile performed by  her PCP 07/16/2021 revealed total cholesterol 132 with an LDL of 62 and HDL 54, markedly improved compared to prior readings.  Palpitations History of palpitations which have improved since I saw her 3 months ago.  She did have an event monitor that showed frequent PVCs, PACs, short runs of SVT and nonsustained ventricular tachycardia.  No further evaluation is required at this time given the improvement in her symptoms.     Lorretta Harp MD FACP,FACC,FAHA, [facility] 08/17/2021 5:03 PM

## 2021-07-28 NOTE — Patient Instructions (Signed)
Medication Instructions:  Continue same medications *If you need a refill on your cardiac medications before your next appointment, please call your pharmacy*   Lab Work: None ordered   Testing/Procedures: None ordered   Follow-Up: At [facility], you and your health needs are our priority.  As part of our continuing mission to provide you with exceptional heart care, we have created designated Provider Care Teams.  These Care Teams include your primary Cardiologist (physician) and Advanced Practice Providers (APPs -  Physician Assistants and Nurse Practitioners) who all work together to provide you with the care you need, when you need it.  We recommend signing up for the patient portal called "MyChart".  Sign up information is provided on this After Visit Summary.  MyChart is used to connect with patients for Virtual Visits (Telemedicine).  Patients are able to view lab/test results, encounter notes, upcoming appointments, etc.  Non-urgent messages can be sent to your provider as well.   To learn more about what you can do with MyChart, go to [email or web address].      Your next appointment:  6 months   Call in Feb to schedule May appointment    The format for your next appointment: Office   Provider:  Dr.Berry    Schedule appointment with Dr.Hilty in [facility]

## 2021-07-28 NOTE — Assessment & Plan Note (Addendum)
History of hyperlipidemia intolerant to statin therapy.  She was recently begun on Repatha approxi-3 months ago however she does have some symptoms in her lower extremities for several days after each injection.  I am going to refer her to Dr. Debara Pickett for further evaluation and treatment.  Given her elevated coronary calcium score, I would prefer her LDL to be less than 70.  Her recent lipid profile performed by her PCP 07/16/2021 revealed total cholesterol 132 with an LDL of 62 and HDL 54, markedly improved compared to prior readings.

## 2021-07-28 NOTE — Assessment & Plan Note (Signed)
History of atypical chest pain when I saw her 3 months ago which has since resolved.  She did have a normal cath which I performed 09/29/2011, coronary CTA performed 03/26/2021 revealed a coronary calcium score of 184 with minimal nonobstructive disease.

## 2021-08-16 DIAGNOSIS — M4316 Spondylolisthesis, lumbar region: Secondary | ICD-10-CM | POA: Diagnosis not present

## 2021-08-16 DIAGNOSIS — M5416 Radiculopathy, lumbar region: Secondary | ICD-10-CM | POA: Diagnosis not present

## 2021-09-22 ENCOUNTER — Telehealth: Payer: Self-pay | Admitting: Cardiovascular Disease

## 2021-09-22 NOTE — Telephone Encounter (Signed)
Pts husband is calling in stating that someone was supposed to be reaching out to him to get an appt setup in regards to pts repatha ... I asked if this should be w/ Karren Cobble he states that it is a doctor who she is supposed to see. No notes in chart in regards to repatha .. please advise

## 2021-09-22 NOTE — Telephone Encounter (Signed)
Returned a call to pt and the pt's husband who stated that the repatha is causing issues so I added to allergy list and they also stated that dr berry wanted her to see dr. Debara Pickett. They want to know if they can stop repatha since they are having issues and I verbally asked chris pavero and he stated that they can stop. They need an appt scheduled w/dr. Debara Pickett prior to dr. Kennon Holter appt. Routing to [facility] scheduling pool

## 2021-09-24 NOTE — Telephone Encounter (Signed)
Referral entered. LVM for patient to schedule.

## 2021-10-14 DIAGNOSIS — M5416 Radiculopathy, lumbar region: Secondary | ICD-10-CM | POA: Diagnosis not present

## 2021-10-21 DIAGNOSIS — E782 Mixed hyperlipidemia: Secondary | ICD-10-CM | POA: Diagnosis not present

## 2021-10-22 LAB — LIPID PANEL
Chol/HDL Ratio: 3.1 ratio (ref 0.0–4.4)
Cholesterol, Total: 195 mg/dL (ref 100–199)
HDL: 62 mg/dL (ref >39)
LDL Chol Calc (NIH): 103 mg/dL — ABNORMAL HIGH (ref 0–99)
Triglycerides: 172 mg/dL — ABNORMAL HIGH (ref 0–149)
VLDL Cholesterol Cal: 30 mg/dL (ref 5–40)

## 2021-10-29 ENCOUNTER — Ambulatory Visit: Payer: Medicare Other | Admitting: Internal Medicine

## 2021-10-29 ENCOUNTER — Other Ambulatory Visit: Payer: Self-pay

## 2021-10-29 ENCOUNTER — Encounter: Payer: Self-pay | Admitting: Internal Medicine

## 2021-10-29 VITALS — BP 140/84 | HR 68 | Ht 64.0 in | Wt 174.8 lb

## 2021-10-29 DIAGNOSIS — E785 Hyperlipidemia, unspecified: Secondary | ICD-10-CM

## 2021-10-29 DIAGNOSIS — T466X5A Adverse effect of antihyperlipidemic and antiarteriosclerotic drugs, initial encounter: Secondary | ICD-10-CM

## 2021-10-29 DIAGNOSIS — M791 Myalgia, unspecified site: Secondary | ICD-10-CM

## 2021-10-29 DIAGNOSIS — I2510 Atherosclerotic heart disease of native coronary artery without angina pectoris: Secondary | ICD-10-CM | POA: Diagnosis not present

## 2021-10-29 MED ORDER — EZETIMIBE 10 MG PO TABS: 10.0000 mg | ORAL_TABLET | Freq: Every day | ORAL | 3 refills | Status: DC

## 2021-10-29 NOTE — Progress Notes (Signed)
LIPID CLINIC CONSULT NOTE  Chief Complaint:  Manage dyslipidemia  Primary Care Physician: Sharilyn Sites, MD  Primary Cardiologist:  None  HPI:  Anna Gibson is a 76 y.o. female who is being seen today for the evaluation of dyslipidemia at the request of Lorretta Harp, MD. this is a pleasant 76 year old female kindly referred for evaluation management of dyslipidemia by Dr. Gwenlyn Found.  She has coronary artery disease based on elevated coronary calcium with mild obstruction on the CT scan (ASCVD).  She also has risk factors including hypertension.  She had been on statin therapy particularly rosuvastatin which caused her myalgias.  More recently she was started on Repatha by our hypertension clinic pharmacist however she said she had side effects with that including worsening lower extremity pain.  She discontinued it and said this is improved.  She is here to consider other options  PMHx:  Past Medical History:  Diagnosis Date   Burning sensation of mouth 08/13/2014   GERD (gastroesophageal reflux disease)    HTN (hypertension), though recently low BP 09/29/2011   Hypertension    Vitamin D deficiency     Past Surgical History:  Procedure Laterality Date   BIOPSY  09/21/2018   Procedure: BIOPSY;  Surgeon: Daneil Dolin, MD;  Location: [facility];  Service: Endoscopy;;  gastric   CARDIAC CATHETERIZATION     CESAREAN SECTION     CESAREAN SECTION     CHOLECYSTECTOMY     COLONOSCOPY  08/14/2007   Rare sigmoid colon diverticula.  Random colon biopsies benign.  Repeat colonoscopy in 10 years.   ESOPHAGOGASTRODUODENOSCOPY  08/14/2007    2 cm hiatal hernia, multiple sessile gastric polyps in the fundus, multiple erosions in the antrum, normal duodenum/Gastritis is the likely cause for her abdominal pain.  No H. pylori or celiac sprue.   ESOPHAGOGASTRODUODENOSCOPY N/A 09/21/2018   Dr. Gala Romney: mild erosive reflux esophagitis, few gastric polyps s/p resection and retrieval, normal  duodenum, fundic gland polyp   FLEXIBLE SIGMOIDOSCOPY N/A 12/30/2016   non-thrombosed external hemorrhoids, redundant rectosigmoid colon, internal hemorrhoids   LEFT HEART CATHETERIZATION WITH CORONARY ANGIOGRAM N/A 09/29/2011   Procedure: LEFT HEART CATHETERIZATION WITH CORONARY ANGIOGRAM;  Surgeon: Lorretta Harp, MD;  Location: [facility] CATH LAB;  Service: Cardiovascular;  Laterality: N/A;    FAMHx:  Family History  Problem Relation Age of Onset   Congenital heart disease Mother    Coronary artery disease Brother    Colon cancer Neg Hx     SOCHx:   reports that she has never smoked. She has never used smokeless tobacco. She reports that she does not drink alcohol and does not use drugs.  ALLERGIES:  Allergies  Allergen Reactions   Crestor [Rosuvastatin]     Sick and myalgias   Gabapentin     Dizzy spacy    Indomethacin Other (See Comments)    "sever headache"   Lipitor [Atorvastatin]     myalgias   Repatha [Evolocumab]     ROS: Pertinent items noted in HPI and remainder of comprehensive ROS otherwise negative.  HOME MEDS: Current Outpatient Medications on File Prior to Visit  Medication Sig Dispense Refill   acetaminophen (TYLENOL) 650 MG CR tablet Take 650-1,300 mg by mouth daily as needed for pain.      ALPRAZolam (XANAX) 1 MG tablet Take 1 mg by mouth at bedtime.     Biotin 2500 MCG CAPS Take 1 capsule by mouth daily. Hair,skin,nails and collegen  bisoprolol-hydrochlorothiazide (ZIAC) 5-6.25 MG tablet Take 1 tablet by mouth daily.     Cholecalciferol (VITAMIN D-3) 125 MCG (5000 UT) TABS Take 5,000 Units by mouth daily.     KRILL OIL PO Take by mouth daily.     Magnesium 400 MG TABS Take 400 mg by mouth daily.      Multiple Vitamins-Minerals (WOMENS 50+ MULTI VITAMIN/MIN PO) Take 1 tablet by mouth daily.     pantoprazole (PROTONIX) 40 MG tablet 1 PO 30 MINS PRIOR TO YOUR FIRST MEAL 90 tablet 3   Probiotic Product (PROBIOTIC DAILY PO) Take 1 tablet by mouth daily.      psyllium (METAMUCIL) 58.6 % powder Take 1 packet by mouth daily in the afternoon. SUGAR FREE-ORANGE FLAVORED     RUTIN PO Take 1 capsule by mouth daily in the afternoon.     Aloe Vera GEL Apply topically. (Patient not taking: Reported on 10/29/2021)     ASTAXANTHIN PO Take by mouth. (Patient not taking: Reported on 10/29/2021)     Evolocumab (REPATHA SURECLICK) 767 MG/ML SOAJ Inject 1 mL into the skin every 14 (fourteen) days. (Patient not taking: Reported on 10/29/2021) 2 mL 11   famotidine (PEPCID) 20 MG tablet Take 20 mg by mouth daily as needed for heartburn. Takes 6 days out of the week. (Patient not taking: Reported on 10/29/2021)     metoprolol tartrate (LOPRESSOR) 50 MG tablet Take 1 tablet (50 mg total) by mouth once for 1 dose. Take 2 hours prior to procedure. 1 tablet 0   No current facility-administered medications on file prior to visit.    LABS/IMAGING: No results found for this or any previous visit (from the past 48 hour(s)). No results found.  LIPID PANEL:    Component Value Date/Time   CHOL 195 10/21/2021 1038   TRIG 172 (H) 10/21/2021 1038   HDL 62 10/21/2021 1038   CHOLHDL 3.1 10/21/2021 1038   CHOLHDL 3.2 09/29/2011 0936   VLDL 16 09/29/2011 0936   LDLCALC 103 (H) 10/21/2021 1038    WEIGHTS: Wt Readings from Last 3 Encounters:  10/29/21 174 lb 12.8 oz (79.3 kg)  07/28/21 168 lb 6.4 oz (76.4 kg)  04/22/21 173 lb 6.4 oz (78.7 kg)    VITALS: BP 140/84 (BP Location: Left Arm, Patient Position: Sitting, Cuff Size: Large)    Pulse 68    Ht 5\' 4"  (1.626 m)    Wt 174 lb 12.8 oz (79.3 kg)    SpO2 99%    BMI 30.00 kg/m   EXAM: Deferred  EKG: N/A  ASSESSMENT: Mixed dyslipidemia, goal LDL less than 70 Statin intolerant-myalgias Repatha intolerant-myalgias  PLAN: 1.   [name] presents for management of her dyslipidemia.  Her LDL cholesterol was 147 in July however she cannot tolerate Repatha.  She reports being off of it for quite a while.  She just had  repeat lipids 8 days ago which showed total cholesterol now 195, triglycerides 172, HDL 62 and LDL 103, again not on any therapy.  This puts her a lot closer to her target LDL.  I discussed the possibility of using ezetimibe which she is agreeable to trying.  We will start 10 mg daily.  This may get her close to goal.  Alternatively we might consider adding bempedoic acid.  Plan follow-up with me and repeat lipids in 3 to 4 months  Thanks again for the kind referral.  Pixie Casino, MD, [facility], [place]  Medical Director of the [facility]  [facility] Diplomate of the American Board of Clinical Lipidology Attending Cardiologist  Direct Dial: [phone]   Fax: [phone]  Website:  [email or web address]  Nadean Corwin Belkis Norbeck 10/29/2021, 2:13 PM

## 2021-10-29 NOTE — Patient Instructions (Signed)
Medication Instructions:  START zetia 10mg  daily  *If you need a refill on your cardiac medications before your next appointment, please call your pharmacy*   Lab Work: FASTING lab work to check cholesterol in about 3-4 months  -- complete 1 week before your next appointment   If you have labs (blood work) drawn today and your tests are completely normal, you will receive your results only by: [place] (if you have MyChart) OR A paper copy in the mail If you have any lab test that is abnormal or we need to change your treatment, we will call you to review the results.   Testing/Procedures: NONE   Follow-Up: At [facility], you and your health needs are our priority.  As part of our continuing mission to provide you with exceptional heart care, we have created designated Provider Care Teams.  These Care Teams include your primary Cardiologist (physician) and Advanced Practice Providers (APPs -  Physician Assistants and Nurse Practitioners) who all work together to provide you with the care you need, when you need it.  We recommend signing up for the patient portal called "MyChart".  Sign up information is provided on this After Visit Summary.  MyChart is used to connect with patients for Virtual Visits (Telemedicine).  Patients are able to view lab/test results, encounter notes, upcoming appointments, etc.  Non-urgent messages can be sent to your provider as well.   To learn more about what you can do with MyChart, go to [email or web address].    Your next appointment:   3-4 months with Dr. Debara Pickett -- lipid clinic Can schedule at either [facility] office or [place] office

## 2022-01-13 DIAGNOSIS — M5416 Radiculopathy, lumbar region: Secondary | ICD-10-CM | POA: Diagnosis not present

## 2022-01-20 DIAGNOSIS — I10 Essential (primary) hypertension: Secondary | ICD-10-CM | POA: Diagnosis not present

## 2022-01-20 DIAGNOSIS — Z0001 Encounter for general adult medical examination with abnormal findings: Secondary | ICD-10-CM | POA: Diagnosis not present

## 2022-01-20 DIAGNOSIS — E782 Mixed hyperlipidemia: Secondary | ICD-10-CM | POA: Diagnosis not present

## 2022-01-20 DIAGNOSIS — R7309 Other abnormal glucose: Secondary | ICD-10-CM | POA: Diagnosis not present

## 2022-01-20 DIAGNOSIS — E039 Hypothyroidism, unspecified: Secondary | ICD-10-CM | POA: Diagnosis not present

## 2022-01-25 DIAGNOSIS — E039 Hypothyroidism, unspecified: Secondary | ICD-10-CM | POA: Diagnosis not present

## 2022-01-25 DIAGNOSIS — E782 Mixed hyperlipidemia: Secondary | ICD-10-CM | POA: Diagnosis not present

## 2022-01-26 LAB — LIPID PANEL
Chol/HDL Ratio: 3.5 ratio (ref 0.0–4.4)
Cholesterol, Total: 204 mg/dL — ABNORMAL HIGH (ref 100–199)
HDL: 58 mg/dL (ref >39)
LDL Chol Calc (NIH): 116 mg/dL — ABNORMAL HIGH (ref 0–99)
Triglycerides: 174 mg/dL — ABNORMAL HIGH (ref 0–149)
VLDL Cholesterol Cal: 30 mg/dL (ref 5–40)

## 2022-02-03 ENCOUNTER — Encounter: Payer: Self-pay | Admitting: Internal Medicine

## 2022-02-03 ENCOUNTER — Ambulatory Visit: Payer: Medicare Other | Admitting: Internal Medicine

## 2022-02-03 VITALS — BP 147/84 | HR 64 | Ht 64.0 in | Wt 175.2 lb

## 2022-02-03 DIAGNOSIS — E785 Hyperlipidemia, unspecified: Secondary | ICD-10-CM

## 2022-02-03 DIAGNOSIS — M791 Myalgia, unspecified site: Secondary | ICD-10-CM

## 2022-02-03 DIAGNOSIS — I10 Essential (primary) hypertension: Secondary | ICD-10-CM

## 2022-02-03 DIAGNOSIS — I2510 Atherosclerotic heart disease of native coronary artery without angina pectoris: Secondary | ICD-10-CM

## 2022-02-03 DIAGNOSIS — T466X5D Adverse effect of antihyperlipidemic and antiarteriosclerotic drugs, subsequent encounter: Secondary | ICD-10-CM

## 2022-02-03 DIAGNOSIS — T466X5A Adverse effect of antihyperlipidemic and antiarteriosclerotic drugs, initial encounter: Secondary | ICD-10-CM

## 2022-02-03 NOTE — Progress Notes (Signed)
LIPID CLINIC CONSULT NOTE  Chief Complaint:  Manage dyslipidemia  Primary Care Physician: Sharilyn Sites, MD  Primary Cardiologist:  None  HPI:  Anna Gibson is a 76 y.o. female who is being seen today for the evaluation of dyslipidemia at the request of Sharilyn Sites, MD. this is a pleasant 76 year old female kindly referred for evaluation management of dyslipidemia by Dr. Gwenlyn Found.  She has coronary artery disease based on elevated coronary calcium with mild obstruction on the CT scan (ASCVD).  She also has risk factors including hypertension.  She had been on statin therapy particularly rosuvastatin which caused her myalgias.  More recently she was started on Repatha by our hypertension clinic pharmacist however she said she had side effects with that including worsening lower extremity pain.  She discontinued it and said this is improved.  She is here to consider other options  02/03/2022  Anna Gibson returns today for follow-up.  She has been on the ezetimibe but unfortunately she has had worsening symptoms associated with it.  Primarily she has been fatigued.  She said it was severe at 1 point and had trouble getting out of bed.  She stopped it for 2 weeks and noticed a marked improvement and then restarted it.  Symptoms then returned and she has been taking it every 3 days.  She still feels fatigued.  Repeat lipids were just performed.  Total cholesterol now 204, triglycerides 174, HDL 58 and LDL 116 which is actually slightly higher.  She had similar side effects on Repatha which caused significant fatigue the lasted for about a week at a time.  She would just get better and then had to take another dose.  She could not tolerate statins because of myalgias.  Her options are very limited at this point.  She does have coronary disease and aortic atherosclerosis.  PMHx:  Past Medical History:  Diagnosis Date   Burning sensation of mouth 08/13/2014   GERD (gastroesophageal reflux disease)     HTN (hypertension), though recently low BP 09/29/2011   Hypertension    Vitamin D deficiency     Past Surgical History:  Procedure Laterality Date   BIOPSY  09/21/2018   Procedure: BIOPSY;  Surgeon: Daneil Dolin, MD;  [facility];  Service: Endoscopy;;  gastric   CARDIAC CATHETERIZATION     CESAREAN SECTION     CESAREAN SECTION     CHOLECYSTECTOMY     COLONOSCOPY  08/14/2007   Rare sigmoid colon diverticula.  Random colon biopsies benign.  Repeat colonoscopy in 10 years.   ESOPHAGOGASTRODUODENOSCOPY  08/14/2007    2 cm hiatal hernia, multiple sessile gastric polyps in the fundus, multiple erosions in the antrum, normal duodenum/Gastritis is the likely cause for her abdominal pain.  No H. pylori or celiac sprue.   ESOPHAGOGASTRODUODENOSCOPY N/A 09/21/2018   Dr. Gala Romney: mild erosive reflux esophagitis, few gastric polyps s/p resection and retrieval, normal duodenum, fundic gland polyp   FLEXIBLE SIGMOIDOSCOPY N/A 12/30/2016   non-thrombosed external hemorrhoids, redundant rectosigmoid colon, internal hemorrhoids   LEFT HEART CATHETERIZATION WITH CORONARY ANGIOGRAM N/A 09/29/2011   Procedure: LEFT HEART CATHETERIZATION WITH CORONARY ANGIOGRAM;  Surgeon: Lorretta Harp, MD;  Location: [facility] CATH LAB;  Service: Cardiovascular;  Laterality: N/A;    FAMHx:  Family History  Problem Relation Age of Onset   Congenital heart disease Mother    Coronary artery disease Brother    Colon cancer Neg Hx     SOCHx:   reports  that she has never smoked. She has never used smokeless tobacco. She reports that she does not drink alcohol and does not use drugs.  ALLERGIES:  Allergies  Allergen Reactions   Crestor [Rosuvastatin]     Sick and myalgias   Gabapentin     Dizzy spacy    Indomethacin Other (See Comments)    "sever headache"   Lipitor [Atorvastatin]     myalgias   Repatha [Evolocumab]    Zetia [Ezetimibe]     fatigue    ROS: Pertinent items noted in HPI and remainder of  comprehensive ROS otherwise negative.  HOME MEDS: Current Outpatient Medications on File Prior to Visit  Medication Sig Dispense Refill   acetaminophen (TYLENOL) 650 MG CR tablet Take 650-1,300 mg by mouth daily as needed for pain.      Aloe Vera GEL Apply topically.     ALPRAZolam (XANAX) 1 MG tablet Take 1 mg by mouth at bedtime.     Biotin 2500 MCG CAPS Take 1 capsule by mouth daily. Hair,skin,nails and collegen     bisoprolol-hydrochlorothiazide (ZIAC) 5-6.25 MG tablet Take 1 tablet by mouth daily.     Cholecalciferol (VITAMIN D-3) 125 MCG (5000 UT) TABS Take 5,000 Units by mouth daily.     famotidine (PEPCID) 20 MG tablet Take 20 mg by mouth daily as needed for heartburn. Takes 6 days out of the week.     KRILL OIL PO Take by mouth daily.     Magnesium 400 MG TABS Take 400 mg by mouth daily.      Multiple Vitamins-Minerals (WOMENS 50+ MULTI VITAMIN/MIN PO) Take 1 tablet by mouth daily.     pantoprazole (PROTONIX) 40 MG tablet 1 PO 30 MINS PRIOR TO YOUR FIRST MEAL 90 tablet 3   Probiotic Product (PROBIOTIC DAILY PO) Take 1 tablet by mouth daily.     psyllium (METAMUCIL) 58.6 % powder Take 1 packet by mouth daily in the afternoon. SUGAR FREE-ORANGE FLAVORED     Evolocumab (REPATHA SURECLICK) 063 MG/ML SOAJ Inject 1 mL into the skin every 14 (fourteen) days. (Patient not taking: Reported on 10/29/2021) 2 mL 11   No current facility-administered medications on file prior to visit.    LABS/IMAGING: No results found for this or any previous visit (from the past 48 hour(s)). No results found.  LIPID PANEL:    Component Value Date/Time   CHOL 204 (H) 01/25/2022 1017   TRIG 174 (H) 01/25/2022 1017   HDL 58 01/25/2022 1017   CHOLHDL 3.5 01/25/2022 1017   CHOLHDL 3.2 09/29/2011 0936   VLDL 16 09/29/2011 0936   LDLCALC 116 (H) 01/25/2022 1017    WEIGHTS: Wt Readings from Last 3 Encounters:  02/03/22 175 lb 3.2 oz (79.5 kg)  10/29/21 174 lb 12.8 oz (79.3 kg)  07/28/21 168 lb 6.4 oz  (76.4 kg)    VITALS: BP (!) 147/84   Pulse 64   Ht '5\' 4"'$  (1.626 m)   Wt 175 lb 3.2 oz (79.5 kg)   SpO2 98%   BMI 30.07 kg/m   EXAM: Deferred  EKG: N/A  ASSESSMENT: Mixed dyslipidemia, goal LDL less than 70 Statin intolerant-myalgias Repatha intolerant-myalgias Ezetimibe intolerance-fatigue  PLAN: 1.   Anna Gibson has been intolerant to statins, Repatha and more recently ezetimibe.  Her options for lipid-lowering are limited.  She does have coronary artery disease and remains well above target LDL less than 70.  Given multiple intolerances, we could consider Leqvio as an alternative.  This seems to  be well-tolerated with very few intolerances however cost and approval may be difficult.  We will go ahead and try prior authorization for it.  Perhaps she can get it through the company as a grant because cost may be prohibitive.  If she is successful with therapy plan follow-up with repeat lipids in 6 months.  Pixie Casino, MD, [facility], [place] Director of the Advanced Lipid Disorders &  Cardiovascular Risk Reduction Clinic Diplomate of the American Board of Clinical Lipidology Attending Cardiologist  Direct Dial: [phone]  Fax: [phone]  Website:  [email or web address] 02/03/2022, 3:56 PM

## 2022-02-03 NOTE — Patient Instructions (Addendum)
Medication Instructions:  We will submit a benefits investigation for LEQVIO This may take a few weeks to get info back, complete prior authorization, check on patient assistance  For patient assistance, you will need to get together a copy of proof of income for household   *If you need a refill on your cardiac medications before your next appointment, please call your pharmacy*   Lab Work: FASTING labs in about 6 months  If you have labs (blood work) drawn today and your tests are completely normal, you will receive your results only by: [place] (if you have MyChart) OR A paper copy in the mail If you have any lab test that is abnormal or we need to change your treatment, we will call you to review the results.   Testing/Procedures: NONE   Follow-Up: At [facility], you and your health needs are our priority.  As part of our continuing mission to provide you with exceptional heart care, we have created designated Provider Care Teams.  These Care Teams include your primary Cardiologist (physician) and Advanced Practice Providers (APPs -  Physician Assistants and Nurse Practitioners) who all work together to provide you with the care you need, when you need it.  We recommend signing up for the patient portal called "MyChart".  Sign up information is provided on this After Visit Summary.  MyChart is used to connect with patients for Virtual Visits (Telemedicine).  Patients are able to view lab/test results, encounter notes, upcoming appointments, etc.  Non-urgent messages can be sent to your provider as well.   To learn more about what you can do with MyChart, go to [email or web address].    Your next appointment:    6 months with Dr. Debara Pickett -- lipid clinic/[place] office

## 2022-02-04 ENCOUNTER — Telehealth: Payer: Self-pay | Admitting: Internal Medicine

## 2022-02-04 NOTE — Telephone Encounter (Signed)
Patient has been identified as candidate for [employer] form for benefits investigation faxed 02/04/22

## 2022-02-25 NOTE — Telephone Encounter (Addendum)
Patient has been identified as candidate for Leqvio  Benefits investigation enrollment completed on 02/07/2022  Benefits investigation report notes the following:  Type of insurance: [employer] Medicare Advantage - HMO POS  OOP Max: $3600 / Met: $60.78  Deductible: n/a  Co-insurance: 20%  PA required: YES  PA phone number: [phone]  Benefits Summary Details:  [employer] Medicare Advantage HMO POS plan. Leqvio is covered at 80% and the patient will be responsible for a 20% coinsurance until she reaches at $3600 calendar OOP max. Once met, Leqvio will be covered at 100%.   _______________ PA submitted via phone on 02/25/22 Dx: I25.10, E78.2 Unable to tolerate statins, zetia, Repatha  Total time: 20 minutes  Fax: [phone] Pending auth #: [id number] Faxed MD note, lab results, CT results  

## 2022-02-25 NOTE — Telephone Encounter (Signed)
Patient called w/status update on Leqvio. Informed her that PA was submitted, I would call once approved, and for patient assistance I would need to know household size and she needs to provide proof of household income.

## 2022-03-16 NOTE — Telephone Encounter (Signed)
Called [employer] to check on status of med Approved 02/25/22 - 02/26/23 Requested a fax be sent to office

## 2022-03-21 NOTE — Telephone Encounter (Signed)
Attempted to call patient w/update on med approval Phone rang continuously with no answer  Will need her household size and for her to provide a copy of proof of household income to be fax w/approval letter to [facility]

## 2022-03-24 NOTE — Telephone Encounter (Signed)
Left message to call back to discuss Leqvio, patient assistance

## 2022-03-25 NOTE — Telephone Encounter (Signed)
Spoke with patient. Notified her med is approved. Advised she needs to provide proof of household income for patient assistance and household size = 2. Advised cannot process patient assistance without this info.

## 2022-03-25 NOTE — Telephone Encounter (Signed)
Patient returned RN's call. 

## 2022-03-29 DIAGNOSIS — M5416 Radiculopathy, lumbar region: Secondary | ICD-10-CM | POA: Diagnosis not present

## 2022-03-31 NOTE — Telephone Encounter (Signed)
Submitted application for patient assistance/empath to [place]/[employer] on 03/31/22

## 2022-04-05 DIAGNOSIS — K529 Noninfective gastroenteritis and colitis, unspecified: Secondary | ICD-10-CM | POA: Diagnosis not present

## 2022-05-10 DIAGNOSIS — K589 Irritable bowel syndrome without diarrhea: Secondary | ICD-10-CM | POA: Insufficient documentation

## 2022-05-10 DIAGNOSIS — K469 Unspecified abdominal hernia without obstruction or gangrene: Secondary | ICD-10-CM

## 2022-05-10 HISTORY — DX: Unspecified abdominal hernia without obstruction or gangrene: K46.9

## 2022-05-30 DIAGNOSIS — M79672 Pain in left foot: Secondary | ICD-10-CM | POA: Insufficient documentation

## 2022-06-07 ENCOUNTER — Telehealth: Payer: Self-pay | Admitting: Internal Medicine

## 2022-06-07 NOTE — Telephone Encounter (Signed)
Pt c/o medication issue:  1. Name of Medication: Leqvio  2. How are you currently taking this medication (dosage and times per day)?   3. Are you having a reaction (difficulty breathing--STAT)?   4. What is your medication issue? Pt was approved for this medication and pharmacy is wanting a call or fax to get this filled.  Fax # [phone] pharmacy is located in [place], [place].

## 2022-06-07 NOTE — Telephone Encounter (Signed)
Called placed to [facility] by [employer] to order [facility]   Patient assistance approved until 09/18/22  They will call back to confirm shipping once Rx is processed:  [address] [place] [address]  Attn: Maudie Mercury

## 2022-06-07 NOTE — Telephone Encounter (Signed)
Left message for patient to call back to update her on Leqvio patient assistance approval   Next steps: Referral to [place] for [facility]er to be called into pharmacy, shipped to [facility] location - attn: Maudie Mercury

## 2022-06-08 DIAGNOSIS — M6281 Muscle weakness (generalized): Secondary | ICD-10-CM | POA: Diagnosis not present

## 2022-06-08 DIAGNOSIS — R278 Other lack of coordination: Secondary | ICD-10-CM | POA: Diagnosis not present

## 2022-06-08 NOTE — Telephone Encounter (Signed)
Left message to call back  

## 2022-06-08 NOTE — Telephone Encounter (Signed)
Pt is returning call.  

## 2022-06-09 ENCOUNTER — Telehealth: Payer: Self-pay | Admitting: Pharmacy Technician

## 2022-06-09 NOTE — Telephone Encounter (Signed)
Patient is returning call.  °

## 2022-06-09 NOTE — Telephone Encounter (Signed)
Dr. Debara Pickett, Juluis Rainier note:   Auth Submission: PA UPDATED FOR CHINF SITE: APPROVED Payer: [employer] Medication & CPT/J Code(s) submitted: Leqvio (Inclisiran) [id number] Route of submission (phone, fax, portal): PORTAL Phone # Fax # Auth type: Buy/Bill Units/visits requested: X2 DOSES - Was approved for 2 doses Reference number: [id number] Approval from: 06/09/22 to 06/10/23   LEQVIO NPAF: APPROVED Phone: [phone] Opt-2 Date: 06/03/22 - 09/18/22 ID: [id number]   Pharmacy: [facility] PHONE: [phone] Shipping address has been updated to [place] 06/15/22. Rep: [facility] Once medication arrives we will schedule the patient as soon as possible.

## 2022-06-09 NOTE — Telephone Encounter (Signed)
Spoke with patient. Notified her Anna Gibson is approved with insurance. She will get a call to arrange injection appointments from [facility]  Scheduled for follow up with [facility] MD on 11/10/22

## 2022-06-14 ENCOUNTER — Telehealth: Payer: Self-pay | Admitting: Internal Medicine

## 2022-06-14 NOTE — Telephone Encounter (Signed)
Patient scheduled 06/17/22 for Leqvio injection - patient assistance - [employer]

## 2022-06-14 NOTE — Telephone Encounter (Signed)
Pt spouse calling because pt is due for labs before leqvio injections. He states he has an old lab order and wants to know if he can use that, please advise

## 2022-06-14 NOTE — Telephone Encounter (Signed)
LM for patient that she does not need lipid panel now - will do fasting labs before her Feb 2024 visit, which will be after about 2 doses of leqvio

## 2022-06-14 NOTE — Telephone Encounter (Signed)
Spoke to patient's husband he wanted to ask Dr.Hilty's RN if wife is suppose to have a lipid panel done before her Leqvio injection scheduled this Fri 9/29.Stated he wanted to make sure the lab order he has is still ok to use.Advised I will send message to her.

## 2022-06-17 ENCOUNTER — Ambulatory Visit (INDEPENDENT_AMBULATORY_CARE_PROVIDER_SITE_OTHER): Payer: Medicare Other

## 2022-06-17 VITALS — BP 177/91 | HR 62 | Temp 98.2°F | Resp 16 | Ht 63.0 in | Wt 177.2 lb

## 2022-06-17 DIAGNOSIS — E782 Mixed hyperlipidemia: Secondary | ICD-10-CM | POA: Diagnosis not present

## 2022-06-17 MED ORDER — INCLISIRAN SODIUM 284 MG/1.5ML ~~LOC~~ SOSY
284.0000 mg | PREFILLED_SYRINGE | Freq: Once | SUBCUTANEOUS | Status: AC
  Administered 2022-06-17: 284 mg via SUBCUTANEOUS
  Filled 2022-06-17: qty 1.5

## 2022-06-17 NOTE — Progress Notes (Signed)
Diagnosis: Hyperlipidemia  Provider:  Praveen Mannam MD  Procedure: Injection  Leqvio (inclisiran), Dose: 284 mg, Site: subcutaneous, Number of injections: 1   Discharge: Condition: Good, Destination: Home . AVS provided to patient.   Performed by:  Keagen Heinlen, RN       

## 2022-06-17 NOTE — Patient Instructions (Signed)
Inclisiran Injection What is this medication? INCLISIRAN (in [place] an) treats high cholesterol. It works by decreasing bad cholesterol (such as LDL) in your blood. Changes to diet and exercise are often combined with this medication. This medicine may be used for other purposes; ask your health care provider or pharmacist if you have questions. COMMON BRAND NAME(S): LEQVIO What should I tell my care team before I take this medication? They need to know if you have any of these conditions: An unusual or allergic reaction to inclisiran, other medications, foods, dyes, or preservatives Pregnant or trying to get pregnant Breast-feeding How should I use this medication? This medication is injected under the skin. It is given by your care team in a hospital or clinic setting. Talk to your care team about the use of this medication in children. Special care may be needed. Overdosage: If you think you have taken too much of this medicine contact a poison control center or emergency room at once. NOTE: This medicine is only for you. Do not share this medicine with others. What if I miss a dose? Keep appointments for follow-up doses. It is important not to miss your dose. Call your care team if you are unable to keep an appointment. What may interact with this medication? Interactions are not expected. This list may not describe all possible interactions. Give your health care provider a list of all the medicines, herbs, non-prescription drugs, or dietary supplements you use. Also tell them if you smoke, drink alcohol, or use illegal drugs. Some items may interact with your medicine. What should I watch for while using this medication? Visit your care team for regular checks on your progress. Tell your care team if your symptoms do not start to get better or if they get worse. You may need blood work while you are taking this medication. What side effects may I notice from receiving this  medication? Side effects that you should report to your care team as soon as possible: Allergic reactions--skin rash, itching, hives, swelling of the face, lips, tongue, or throat Shortness of breath Side effects that usually do not require medical attention (report these to your care team if they continue or are bothersome): Diarrhea Joint pain Pain, redness, or irritation at injection site This list may not describe all possible side effects. Call your doctor for medical advice about side effects. You may report side effects to FDA at 1-800-FDA-1088. Where should I keep my medication? This medication is given in a hospital or clinic. It will not be stored at home. NOTE: This sheet is a summary. It may not cover all possible information. If you have questions about this medicine, talk to your doctor, pharmacist, or health care provider.  2023 Elsevier/Gold Standard (2020-09-23 00:00:00)

## 2022-06-28 DIAGNOSIS — R278 Other lack of coordination: Secondary | ICD-10-CM | POA: Diagnosis not present

## 2022-06-28 DIAGNOSIS — M6281 Muscle weakness (generalized): Secondary | ICD-10-CM | POA: Diagnosis not present

## 2022-06-30 DIAGNOSIS — M5416 Radiculopathy, lumbar region: Secondary | ICD-10-CM | POA: Diagnosis not present

## 2022-07-07 DIAGNOSIS — L821 Other seborrheic keratosis: Secondary | ICD-10-CM | POA: Diagnosis not present

## 2022-07-07 DIAGNOSIS — C44319 Basal cell carcinoma of skin of other parts of face: Secondary | ICD-10-CM | POA: Diagnosis not present

## 2022-07-07 DIAGNOSIS — D225 Melanocytic nevi of trunk: Secondary | ICD-10-CM | POA: Diagnosis not present

## 2022-07-15 DIAGNOSIS — R197 Diarrhea, unspecified: Secondary | ICD-10-CM | POA: Diagnosis not present

## 2022-07-21 DIAGNOSIS — R278 Other lack of coordination: Secondary | ICD-10-CM | POA: Diagnosis not present

## 2022-07-21 DIAGNOSIS — M6281 Muscle weakness (generalized): Secondary | ICD-10-CM | POA: Diagnosis not present

## 2022-07-25 ENCOUNTER — Telehealth: Payer: Self-pay | Admitting: Internal Medicine

## 2022-07-25 NOTE — Telephone Encounter (Signed)
Returned call to pt/husband he states that [place] got the Lequvio injections approved and pt just received a large bill. He will call back and choose option 7 to discuss with billing.

## 2022-07-25 NOTE — Telephone Encounter (Signed)
New Message:      Patient says he need to talk to Eliezer Lofts, [facility] nurse.It is concerning patient's medicine(Leqvio).. He said they received a big bill for it and they should have not.been billed.

## 2022-07-28 DIAGNOSIS — R278 Other lack of coordination: Secondary | ICD-10-CM | POA: Diagnosis not present

## 2022-07-28 DIAGNOSIS — M6281 Muscle weakness (generalized): Secondary | ICD-10-CM | POA: Diagnosis not present

## 2022-08-19 DIAGNOSIS — H524 Presbyopia: Secondary | ICD-10-CM | POA: Diagnosis not present

## 2022-08-25 ENCOUNTER — Telehealth: Payer: Self-pay | Admitting: Internal Medicine

## 2022-08-25 DIAGNOSIS — Z08 Encounter for follow-up examination after completed treatment for malignant neoplasm: Secondary | ICD-10-CM | POA: Diagnosis not present

## 2022-08-25 DIAGNOSIS — L821 Other seborrheic keratosis: Secondary | ICD-10-CM | POA: Diagnosis not present

## 2022-08-25 DIAGNOSIS — Z85828 Personal history of other malignant neoplasm of skin: Secondary | ICD-10-CM | POA: Diagnosis not present

## 2022-08-25 NOTE — Telephone Encounter (Signed)
Mailed to patient the PAP for leqvio/[employer]

## 2022-09-14 ENCOUNTER — Other Ambulatory Visit: Payer: Self-pay | Admitting: Pharmacist

## 2022-09-14 MED ORDER — INCLISIRAN SODIUM 284 MG/1.5ML ~~LOC~~ SOSY: PREFILLED_SYRINGE | SUBCUTANEOUS | 2 refills | Status: DC

## 2022-09-16 ENCOUNTER — Ambulatory Visit (INDEPENDENT_AMBULATORY_CARE_PROVIDER_SITE_OTHER): Payer: Medicare Other

## 2022-09-16 VITALS — BP 171/81 | HR 64 | Temp 97.4°F | Resp 16 | Ht 64.0 in | Wt 180.4 lb

## 2022-09-16 DIAGNOSIS — E782 Mixed hyperlipidemia: Secondary | ICD-10-CM | POA: Diagnosis not present

## 2022-09-16 MED ORDER — INCLISIRAN SODIUM 284 MG/1.5ML ~~LOC~~ SOSY
284.0000 mg | PREFILLED_SYRINGE | Freq: Once | SUBCUTANEOUS | Status: AC
  Administered 2022-09-16: 284 mg via SUBCUTANEOUS
  Filled 2022-09-16: qty 1.5

## 2022-09-16 NOTE — Progress Notes (Cosign Needed)
Diagnosis: Hyperlipidemia  Provider:  Marshell Garfinkel MD  Procedure: Injection  Leqvio (inclisiran), Dose: 284 mg, Site: subcutaneous, Number of injections: 1  Post Care:  n/a  Discharge: Condition: Good, Destination: Home . AVS provided to patient.   Performed by:  Adelina Mings, LPN

## 2022-10-04 DIAGNOSIS — M5416 Radiculopathy, lumbar region: Secondary | ICD-10-CM | POA: Diagnosis not present

## 2022-10-10 ENCOUNTER — Encounter: Payer: Self-pay | Admitting: Internal Medicine

## 2022-10-10 ENCOUNTER — Other Ambulatory Visit (HOSPITAL_COMMUNITY): Payer: Self-pay

## 2022-10-11 DIAGNOSIS — G9332 Myalgic encephalomyelitis/chronic fatigue syndrome: Secondary | ICD-10-CM | POA: Diagnosis not present

## 2022-10-11 DIAGNOSIS — E039 Hypothyroidism, unspecified: Secondary | ICD-10-CM | POA: Diagnosis not present

## 2022-10-11 DIAGNOSIS — E7849 Other hyperlipidemia: Secondary | ICD-10-CM | POA: Diagnosis not present

## 2022-10-11 DIAGNOSIS — I10 Essential (primary) hypertension: Secondary | ICD-10-CM | POA: Diagnosis not present

## 2022-10-11 DIAGNOSIS — E782 Mixed hyperlipidemia: Secondary | ICD-10-CM | POA: Diagnosis not present

## 2022-10-11 DIAGNOSIS — Z0001 Encounter for general adult medical examination with abnormal findings: Secondary | ICD-10-CM | POA: Diagnosis not present

## 2022-10-11 DIAGNOSIS — Z23 Encounter for immunization: Secondary | ICD-10-CM | POA: Diagnosis not present

## 2022-11-01 DIAGNOSIS — E785 Hyperlipidemia, unspecified: Secondary | ICD-10-CM | POA: Diagnosis not present

## 2022-11-02 LAB — LIPID PANEL
Chol/HDL Ratio: 3.1 ratio (ref 0.0–4.4)
Cholesterol, Total: 177 mg/dL (ref 100–199)
HDL: 57 mg/dL (ref >39)
LDL Chol Calc (NIH): 87 mg/dL (ref 0–99)
Triglycerides: 195 mg/dL — ABNORMAL HIGH (ref 0–149)
VLDL Cholesterol Cal: 33 mg/dL (ref 5–40)

## 2022-11-10 ENCOUNTER — Ambulatory Visit: Payer: Medicare Other | Attending: Internal Medicine | Admitting: Internal Medicine

## 2022-11-10 ENCOUNTER — Telehealth: Payer: Self-pay | Admitting: Pharmacy Technician

## 2022-11-10 ENCOUNTER — Encounter: Payer: Self-pay | Admitting: Internal Medicine

## 2022-11-10 VITALS — BP 178/89 | HR 66 | Ht 64.0 in | Wt 177.8 lb

## 2022-11-10 DIAGNOSIS — E785 Hyperlipidemia, unspecified: Secondary | ICD-10-CM

## 2022-11-10 DIAGNOSIS — I2510 Atherosclerotic heart disease of native coronary artery without angina pectoris: Secondary | ICD-10-CM | POA: Diagnosis not present

## 2022-11-10 DIAGNOSIS — T466X5A Adverse effect of antihyperlipidemic and antiarteriosclerotic drugs, initial encounter: Secondary | ICD-10-CM

## 2022-11-10 DIAGNOSIS — M791 Myalgia, unspecified site: Secondary | ICD-10-CM | POA: Diagnosis not present

## 2022-11-10 NOTE — Progress Notes (Signed)
LIPID CLINIC CONSULT NOTE  Chief Complaint:  Follow-up dyslipidemia  Primary Care Physician: Sharilyn Sites, MD  Primary Cardiologist:  None  HPI:  Anna Gibson is a 77 y.o. female who is being seen today for the evaluation of dyslipidemia at the request of Sharilyn Sites, MD. this is a pleasant 77 year old female kindly referred for evaluation management of dyslipidemia by Dr. Gwenlyn Found.  She has coronary artery disease based on elevated coronary calcium with mild obstruction on the CT scan (ASCVD).  She also has risk factors including hypertension.  She had been on statin therapy particularly rosuvastatin which caused her myalgias.  More recently she was started on Repatha by our hypertension clinic pharmacist however she said she had side effects with that including worsening lower extremity pain.  She discontinued it and said this is improved.  She is here to consider other options  02/03/2022  Anna Gibson returns today for follow-up.  She has been on the ezetimibe but unfortunately she has had worsening symptoms associated with it.  Primarily she has been fatigued.  She said it was severe at 1 point and had trouble getting out of bed.  She stopped it for 2 weeks and noticed a marked improvement and then restarted it.  Symptoms then returned and she has been taking it every 3 days.  She still feels fatigued.  Repeat lipids were just performed.  Total cholesterol now 204, triglycerides 174, HDL 58 and LDL 116 which is actually slightly higher.  She had similar side effects on Repatha which caused significant fatigue the lasted for about a week at a time.  She would just get better and then had to take another dose.  She could not tolerate statins because of myalgias.  Her options are very limited at this point.  She does have coronary disease and aortic atherosclerosis.  11/10/2022  Anna Gibson returns today for follow-up of dyslipidemia.  Due to her multiple medication intolerances, ultimately we  were able to arrange for therapy with Leqvio.  After extensive paperwork the medication was available to her at a very reasonable cost.  She underwent the first and second injections over the past 4 months.  She says that she developed symptoms 2 weeks after the first injection including dizziness, lightheadedness and soreness in multiple joints.  Subsequently after her recent injection (the 17-monthdose) which was in December, she developed worsening symptoms the next day.  She said this continues to persist now 2 months later.  She is not interested in continuing with this therapy and says that she generally does not tolerate any medications well.  Despite this she has had improvement in her lipids with total cholesterol down to 177 from 204 and decrease in her LDL to 87 from 116.  There is been a small increase in triglycerides and this is likely due to worsening ability to be active, feels in part due to side effects from the medication.  PMHx:  Past Medical History:  Diagnosis Date   Burning sensation of mouth 08/13/2014   GERD (gastroesophageal reflux disease)    HTN (hypertension), though recently low BP 09/29/2011   Hypertension    Vitamin D deficiency     Past Surgical History:  Procedure Laterality Date   BIOPSY  09/21/2018   Procedure: BIOPSY;  Surgeon: RDaneil Dolin MD;  [facility];  Service: Endoscopy;;  gastric   CARDIAC CATHETERIZATION     [place]  CHOLECYSTECTOMY     COLONOSCOPY  08/14/2007   Rare sigmoid colon diverticula.  Random colon biopsies benign.  Repeat colonoscopy in 10 years.   ESOPHAGOGASTRODUODENOSCOPY  08/14/2007    2 cm hiatal hernia, multiple sessile gastric polyps in the fundus, multiple erosions in the antrum, normal duodenum/Gastritis is the likely cause for her abdominal pain.  No H. pylori or celiac sprue.   ESOPHAGOGASTRODUODENOSCOPY N/A 09/21/2018   Dr. Gala Romney: mild erosive reflux esophagitis, few gastric  polyps s/p resection and retrieval, normal duodenum, fundic gland polyp   FLEXIBLE SIGMOIDOSCOPY N/A 12/30/2016   non-thrombosed external hemorrhoids, redundant rectosigmoid colon, internal hemorrhoids   LEFT HEART CATHETERIZATION WITH CORONARY ANGIOGRAM N/A 09/29/2011   Procedure: LEFT HEART CATHETERIZATION WITH CORONARY ANGIOGRAM;  Surgeon: Lorretta Harp, MD;  Location: [facility] CATH LAB;  Service: Cardiovascular;  Laterality: N/A;    FAMHx:  Family History  Problem Relation Age of Onset   Congenital heart disease Mother    Coronary artery disease Brother    Colon cancer Neg Hx     SOCHx:   reports that she has never smoked. She has never used smokeless tobacco. She reports that she does not drink alcohol and does not use drugs.  ALLERGIES:  Allergies  Allergen Reactions   Crestor [Rosuvastatin]     Sick and myalgias   Gabapentin     Dizzy spacy    Indomethacin Other (See Comments)    "sever headache"   Lipitor [Atorvastatin]     myalgias   Repatha [Evolocumab]    Zetia [Ezetimibe]     fatigue    ROS: Pertinent items noted in HPI and remainder of comprehensive ROS otherwise negative.  HOME MEDS: Current Outpatient Medications on File Prior to Visit  Medication Sig Dispense Refill   acetaminophen (TYLENOL) 650 MG CR tablet Take 650-1,300 mg by mouth daily as needed for pain.      Aloe Vera GEL Apply topically.     Biotin 2500 MCG CAPS Take 1 capsule by mouth daily. Hair,skin,nails and collegen     bisoprolol-hydrochlorothiazide (ZIAC) 5-6.25 MG tablet Take 1 tablet by mouth daily.     Cholecalciferol (VITAMIN D-3) 125 MCG (5000 UT) TABS Take 5,000 Units by mouth daily.     famotidine (PEPCID) 20 MG tablet Take 20 mg by mouth daily as needed for heartburn. Takes 6 days out of the week.     inclisiran (LEQVIO) 284 MG/1.5ML SOSY injection Inject 221m under the skin at month 0 and 3, then every 6 months thereafter 1.5 mL 2   Magnesium 400 MG TABS Take 400 mg by mouth daily.       Multiple Vitamins-Minerals (WOMENS 50+ MULTI VITAMIN/MIN PO) Take 1 tablet by mouth daily.     pantoprazole (PROTONIX) 40 MG tablet 1 PO 30 MINS PRIOR TO YOUR FIRST MEAL 90 tablet 3   Probiotic Product (PROBIOTIC DAILY PO) Take 1 tablet by mouth daily.     psyllium (METAMUCIL) 58.6 % powder Take 1 packet by mouth daily in the afternoon. SUGAR FREE-ORANGE FLAVORED     ALPRAZolam (XANAX) 1 MG tablet Take 1 mg by mouth at bedtime. (Patient not taking: Reported on 11/10/2022)     KRILL OIL PO Take by mouth daily. (Patient not taking: Reported on 11/10/2022)     No current facility-administered medications on file prior to visit.    LABS/IMAGING: No results found for this or any previous visit (from the past 48 hour(s)). No results found.  LIPID PANEL:  Component Value Date/Time   CHOL 177 11/01/2022 1019   TRIG 195 (H) 11/01/2022 1019   HDL 57 11/01/2022 1019   CHOLHDL 3.1 11/01/2022 1019   CHOLHDL 3.2 09/29/2011 0936   VLDL 16 09/29/2011 0936   LDLCALC 87 11/01/2022 1019    WEIGHTS: Wt Readings from Last 3 Encounters:  11/10/22 177 lb 12.8 oz (80.6 kg)  09/16/22 180 lb 6.4 oz (81.8 kg)  06/17/22 177 lb 3.2 oz (80.4 kg)    VITALS: BP (!) 178/89   Pulse 66   Ht 5' 4"$  (1.626 m)   Wt 177 lb 12.8 oz (80.6 kg)   SpO2 98%   BMI 30.52 kg/m   EXAM: Deferred  EKG: N/A  ASSESSMENT: Mixed dyslipidemia, goal LDL less than 70 Statin intolerant-myalgias Repatha intolerant-myalgias Ezetimibe intolerance-fatigue  PLAN: 1.   Mrs. Faw was approved for Leqvio and has had 2 doses with some improvement in her lipids however cholesterol still remains above target and she says she feels that she is having side effects from the [facility] and wishes to stop it.  Additional options are extremely limited.  Regarding FDA approved medications, the only other option she has not tried is Nexletol.  She would like to read further on this.  Will plan a repeat lipid in about 6 months as well as  an LP(a) and follow-up with me at that time.  Per her request we have canceled her July 2024 [facility] appointment.  Pixie Casino, MD, [facility] [place] Director of the Advanced Lipid Disorders &  Cardiovascular Risk Reduction Clinic Diplomate of the American Board of Clinical Lipidology Attending Cardiologist  Direct Dial: [phone]  Fax: [phone]  Website:  [email or web address]  Nadean Corwin Daaron Dimarco 11/10/2022, 2:28 PM

## 2022-11-10 NOTE — Telephone Encounter (Signed)
Patient has requested that July appt be cancelled. Patient states she had (SE) reaction after last leqvio dose. Awaiting f/u notes from MD.

## 2022-11-10 NOTE — Patient Instructions (Addendum)
Medication Instructions:  STOP Leqvio -- we will cancel your upcoming appointments  *If you need a refill on your cardiac medications before your next appointment, please call your pharmacy*   Lab Work: FASTING lab work to check cholesterol July 2024  If you have labs (blood work) drawn today and your tests are completely normal, you will receive your results only by: [place] (if you have MyChart) OR A paper copy in the mail If you have any lab test that is abnormal or we need to change your treatment, we will call you to review the results.    Follow-Up: At [facility], you and your health needs are our priority.  As part of our continuing mission to provide you with exceptional heart care, we have created designated Provider Care Teams.  These Care Teams include your primary Cardiologist (physician) and Advanced Practice Providers (APPs -  Physician Assistants and Nurse Practitioners) who all work together to provide you with the care you need, when you need it.  We recommend signing up for the patient portal called "MyChart".  Sign up information is provided on this After Visit Summary.  MyChart is used to connect with patients for Virtual Visits (Telemedicine).  Patients are able to view lab/test results, encounter notes, upcoming appointments, etc.  Non-urgent messages can be sent to your provider as well.   To learn more about what you can do with MyChart, go to [email or web address].    Your next appointment:     July 2024 with Dr. Debara Pickett

## 2022-12-02 ENCOUNTER — Encounter: Payer: Self-pay | Admitting: Internal Medicine

## 2023-01-05 DIAGNOSIS — M5416 Radiculopathy, lumbar region: Secondary | ICD-10-CM | POA: Diagnosis not present

## 2023-03-03 DIAGNOSIS — I10 Essential (primary) hypertension: Secondary | ICD-10-CM | POA: Diagnosis not present

## 2023-03-03 DIAGNOSIS — H1132 Conjunctival hemorrhage, left eye: Secondary | ICD-10-CM | POA: Diagnosis not present

## 2023-03-21 ENCOUNTER — Ambulatory Visit: Payer: Medicare Other

## 2023-04-04 DIAGNOSIS — E785 Hyperlipidemia, unspecified: Secondary | ICD-10-CM | POA: Diagnosis not present

## 2023-04-09 LAB — LIPID PANEL
Chol/HDL Ratio: 3.2 ratio (ref 0.0–4.4)
Cholesterol, Total: 191 mg/dL (ref 100–199)
HDL: 60 mg/dL
LDL Chol Calc (NIH): 100 mg/dL — ABNORMAL HIGH (ref 0–99)
Triglycerides: 180 mg/dL — ABNORMAL HIGH (ref 0–149)
VLDL Cholesterol Cal: 31 mg/dL (ref 5–40)

## 2023-04-09 LAB — LIPOPROTEIN A (LPA): Lipoprotein (a): 194.9 nmol/L — ABNORMAL HIGH (ref <75.0)

## 2023-04-12 ENCOUNTER — Encounter: Payer: Self-pay | Admitting: Internal Medicine

## 2023-04-12 ENCOUNTER — Ambulatory Visit: Payer: Medicare Other | Attending: Internal Medicine | Admitting: Internal Medicine

## 2023-04-12 VITALS — BP 132/74 | HR 68 | Ht 62.0 in | Wt 178.6 lb

## 2023-04-12 DIAGNOSIS — T466X5D Adverse effect of antihyperlipidemic and antiarteriosclerotic drugs, subsequent encounter: Secondary | ICD-10-CM

## 2023-04-12 DIAGNOSIS — M791 Myalgia, unspecified site: Secondary | ICD-10-CM | POA: Diagnosis not present

## 2023-04-12 DIAGNOSIS — E785 Hyperlipidemia, unspecified: Secondary | ICD-10-CM | POA: Diagnosis not present

## 2023-04-12 DIAGNOSIS — T466X5A Adverse effect of antihyperlipidemic and antiarteriosclerotic drugs, initial encounter: Secondary | ICD-10-CM

## 2023-04-12 DIAGNOSIS — I2510 Atherosclerotic heart disease of native coronary artery without angina pectoris: Secondary | ICD-10-CM | POA: Diagnosis not present

## 2023-04-12 NOTE — Patient Instructions (Signed)
Medication Instructions:  Your physician recommends that you continue on your current medications as directed. Please refer to the Current Medication list given to you today.   *If you need a refill on your cardiac medications before your next appointment, please call your pharmacy*   Lab Work:  None ordered. If you have labs (blood work) drawn today and your tests are completely normal, you will receive your results only by: MyChart Message (if you have MyChart) OR A paper copy in the mail If you have any lab test that is abnormal or we need to change your treatment, we will call you to review the results.   Testing/Procedures:  None ordered.    Follow-Up: At [facility], you and your health needs are our priority.  As part of our continuing mission to provide you with exceptional heart care, we have created designated Provider Care Teams.  These Care Teams include your primary Cardiologist (physician) and Advanced Practice Providers (APPs -  Physician Assistants and Nurse Practitioners) who all work together to provide you with the care you need, when you need it.  We recommend signing up for the patient portal called "MyChart".  Sign up information is provided on this After Visit Summary.  MyChart is used to connect with patients for Virtual Visits (Telemedicine).  Patients are able to view lab/test results, encounter notes, upcoming appointments, etc.  Non-urgent messages can be sent to your provider as well.   To learn more about what you can do with MyChart, go to [email or web address].    Your next appointment:   As needed     Provider:   Italy Hilty, MD

## 2023-04-12 NOTE — Progress Notes (Signed)
LIPID CLINIC CONSULT NOTE  Chief Complaint:  Follow-up dyslipidemia  Primary Care Physician: Assunta Found, MD  Primary Cardiologist:  None  HPI:  Anna Gibson is a 77 y.o. female who is being seen today for the evaluation of dyslipidemia at the request of Assunta Found, MD. this is a pleasant 77 year old female kindly referred for evaluation management of dyslipidemia by Dr. Allyson Sabal.  She has coronary artery disease based on elevated coronary calcium with mild obstruction on the CT scan (ASCVD).  She also has risk factors including hypertension.  She had been on statin therapy particularly rosuvastatin which caused her myalgias.  More recently she was started on Repatha by our hypertension clinic pharmacist however she said she had side effects with that including worsening lower extremity pain.  She discontinued it and said this is improved.  She is here to consider other options  02/03/2022  Anna Gibson returns today for follow-up.  She has been on the ezetimibe but unfortunately she has had worsening symptoms associated with it.  Primarily she has been fatigued.  She said it was severe at 1 point and had trouble getting out of bed.  She stopped it for 2 weeks and noticed a marked improvement and then restarted it.  Symptoms then returned and she has been taking it every 3 days.  She still feels fatigued.  Repeat lipids were just performed.  Total cholesterol now 204, triglycerides 174, HDL 58 and LDL 116 which is actually slightly higher.  She had similar side effects on Repatha which caused significant fatigue the lasted for about a week at a time.  She would just get better and then had to take another dose.  She could not tolerate statins because of myalgias.  Her options are very limited at this point.  She does have coronary disease and aortic atherosclerosis.  11/10/2022  Anna Gibson returns today for follow-up of dyslipidemia.  Due to her multiple medication intolerances, ultimately we  were able to arrange for therapy with Leqvio.  After extensive paperwork the medication was available to her at a very reasonable cost.  She underwent the first and second injections over the past 4 months.  She says that she developed symptoms 2 weeks after the first injection including dizziness, lightheadedness and soreness in multiple joints.  Subsequently after her recent injection (the 66-month dose) which was in December, she developed worsening symptoms the next day.  She said this continues to persist now 2 months later.  She is not interested in continuing with this therapy and says that she generally does not tolerate any medications well.  Despite this she has had improvement in her lipids with total cholesterol down to 177 from 204 and decrease in her LDL to 87 from 116.  There is been a small increase in triglycerides and this is likely due to worsening ability to be active, feels in part due to side effects from the medication.  04/12/2023  Anna Gibson is seen today in follow-up.  Unfortunately she says that she felt horrible on Leqvio and felt very weak and tired.  She ultimately requested to stop the injections which were scheduled to be this month.  Her cholesterol ready is starting to climb up.  Her LDL was 87 now up to 100, total cholesterol 191, triglycerides 180 and HDL 60.  I did test an LP(a) on her which not surprisingly was elevated at 194.9 nmol/L.  We talked about the genetic nature of this and how the levels  are not affected by diet, exercise or weight loss.  It does confer increased risk of cardiovascular disease.  Her options are very limited having failed statins, ezetimibe, PCSK9 inhibitors and Leqvio at this point.  We did discuss the possibility of bempedoic acid however she is not interested in additional therapies at this time.  PMHx:  Past Medical History:  Diagnosis Date   Burning sensation of mouth 08/13/2014   GERD (gastroesophageal reflux disease)    HTN  (hypertension), though recently low BP 09/29/2011   Hypertension    Vitamin D deficiency     Past Surgical History:  Procedure Laterality Date   BIOPSY  09/21/2018   Procedure: BIOPSY;  Surgeon: Corbin Ade, MD;  [facility];  Service: Endoscopy;;  gastric   CARDIAC CATHETERIZATION     CESAREAN SECTION     CESAREAN SECTION     CHOLECYSTECTOMY     COLONOSCOPY  08/14/2007   Rare sigmoid colon diverticula.  Random colon biopsies benign.  Repeat colonoscopy in 10 years.   ESOPHAGOGASTRODUODENOSCOPY  08/14/2007    2 cm hiatal hernia, multiple sessile gastric polyps in the fundus, multiple erosions in the antrum, normal duodenum/Gastritis is the likely cause for her abdominal pain.  No H. pylori or celiac sprue.   ESOPHAGOGASTRODUODENOSCOPY N/A 09/21/2018   Dr. Jena Gauss: mild erosive reflux esophagitis, few gastric polyps s/p resection and retrieval, normal duodenum, fundic gland polyp   FLEXIBLE SIGMOIDOSCOPY N/A 12/30/2016   non-thrombosed external hemorrhoids, redundant rectosigmoid colon, internal hemorrhoids   LEFT HEART CATHETERIZATION WITH CORONARY ANGIOGRAM N/A 09/29/2011   Procedure: LEFT HEART CATHETERIZATION WITH CORONARY ANGIOGRAM;  Surgeon: [name], MD;  Location: [facility] CATH LAB;  Service: Cardiovascular;  Laterality: N/A;    FAMHx:  Family History  Problem Relation Age of Onset   Congenital heart disease Mother    Coronary artery disease Brother    Colon cancer Neg Hx     SOCHx:   reports that she has never smoked. She has never used smokeless tobacco. She reports that she does not drink alcohol and does not use drugs.  ALLERGIES:  Allergies  Allergen Reactions   Crestor [Rosuvastatin]     Sick and myalgias   Gabapentin     Dizzy spacy    Indomethacin Other (See Comments)    "sever headache"   Leqvio [Inclisiran Sodium] Other (See Comments)    dizziness   Lipitor [Atorvastatin]     myalgias   Repatha [Evolocumab]    Zetia [Ezetimibe]     fatigue     ROS: Pertinent items noted in HPI and remainder of comprehensive ROS otherwise negative.  HOME MEDS: Current Outpatient Medications on File Prior to Visit  Medication Sig Dispense Refill   acetaminophen (TYLENOL) 650 MG CR tablet Take 650-1,300 mg by mouth daily as needed for pain.      Aloe Vera GEL Apply topically.     ALPRAZolam (XANAX) 1 MG tablet Take 1 mg by mouth at bedtime.     Biotin 2500 MCG CAPS Take 1 capsule by mouth daily. Hair,skin,nails and collegen     bisoprolol-hydrochlorothiazide (ZIAC) 5-6.25 MG tablet Take 1 tablet by mouth daily.     Cholecalciferol (VITAMIN D-3) 125 MCG (5000 UT) TABS Take 5,000 Units by mouth daily.     Magnesium 400 MG TABS Take 400 mg by mouth daily.      Multiple Vitamins-Minerals (WOMENS 50+ MULTI VITAMIN/MIN PO) Take 1 tablet by mouth daily.     pantoprazole (PROTONIX)  40 MG tablet 1 PO 30 MINS PRIOR TO YOUR FIRST MEAL 90 tablet 3   Probiotic Product (PROBIOTIC DAILY PO) Take 1 tablet by mouth daily.     psyllium (METAMUCIL) 58.6 % powder Take 1 packet by mouth daily in the afternoon. SUGAR FREE-ORANGE FLAVORED     famotidine (PEPCID) 20 MG tablet Take 20 mg by mouth daily as needed for heartburn. Takes 6 days out of the week. (Patient not taking: Reported on 04/12/2023)     KRILL OIL PO Take by mouth daily. (Patient not taking: Reported on 04/12/2023)     No current facility-administered medications on file prior to visit.    LABS/IMAGING: No results found for this or any previous visit (from the past 48 hour(s)). No results found.  LIPID PANEL:    Component Value Date/Time   CHOL 191 04/04/2023 1002   TRIG 180 (H) 04/04/2023 1002   HDL 60 04/04/2023 1002   CHOLHDL 3.2 04/04/2023 1002   CHOLHDL 3.2 09/29/2011 0936   VLDL 16 09/29/2011 0936   LDLCALC 100 (H) 04/04/2023 1002    WEIGHTS: Wt Readings from Last 3 Encounters:  04/12/23 178 lb 9.6 oz (81 kg)  11/10/22 177 lb 12.8 oz (80.6 kg)  09/16/22 180 lb 6.4 oz (81.8 kg)     VITALS: BP 132/74 (BP Location: Left Arm, Patient Position: Sitting, Cuff Size: Normal)   Pulse 68   Ht 5\' 2"  (1.575 m)   Wt 178 lb 9.6 oz (81 kg)   SpO2 98%   BMI 32.67 kg/m   EXAM: Deferred  EKG: N/A  ASSESSMENT: Mixed dyslipidemia, goal LDL less than 70 Statin intolerant-myalgias Repatha intolerant-myalgias Ezetimibe intolerance-fatigue Leqvio intolerance-fatigue, weakness Elevated LP(a) at 194.9 nmol/L  PLAN: 1.   Anna Gibson has had intolerance to statins, Repatha and ezetimibe and more recently Leqvio.  Her LP(a) is elevated and this is a significant risk factor for heart disease.  Unfortunately she cannot tolerate any of the therapies on the market now that may lower this.  We talked about bempedoic acid however she did not want to try at this point.  I would therefore recommend follow-up with me on an as-needed basis.  Chrystie Nose, MD, [facility], FACP  [place]  [facility]  Medical Director of the Advanced Lipid Disorders &  Cardiovascular Risk Reduction Clinic Diplomate of the American Board of Clinical Lipidology Attending Cardiologist  Direct Dial: [phone]  Fax: [phone]  Website:  [email or web address] Nicely Harshika Mago 04/12/2023, 1:47 PM

## 2023-05-08 DIAGNOSIS — M5416 Radiculopathy, lumbar region: Secondary | ICD-10-CM | POA: Diagnosis not present

## 2023-06-06 DIAGNOSIS — M5416 Radiculopathy, lumbar region: Secondary | ICD-10-CM | POA: Diagnosis not present

## 2023-06-06 DIAGNOSIS — M4316 Spondylolisthesis, lumbar region: Secondary | ICD-10-CM | POA: Diagnosis not present

## 2023-06-13 DIAGNOSIS — R197 Diarrhea, unspecified: Secondary | ICD-10-CM | POA: Diagnosis not present

## 2023-06-13 DIAGNOSIS — K219 Gastro-esophageal reflux disease without esophagitis: Secondary | ICD-10-CM | POA: Diagnosis not present

## 2023-08-25 DIAGNOSIS — H524 Presbyopia: Secondary | ICD-10-CM | POA: Diagnosis not present

## 2023-08-31 DIAGNOSIS — M5416 Radiculopathy, lumbar region: Secondary | ICD-10-CM | POA: Diagnosis not present

## 2023-10-17 DIAGNOSIS — E039 Hypothyroidism, unspecified: Secondary | ICD-10-CM | POA: Diagnosis not present

## 2023-10-17 DIAGNOSIS — E612 Magnesium deficiency: Secondary | ICD-10-CM | POA: Diagnosis not present

## 2023-10-17 DIAGNOSIS — G9332 Myalgic encephalomyelitis/chronic fatigue syndrome: Secondary | ICD-10-CM | POA: Diagnosis not present

## 2023-10-17 DIAGNOSIS — Z0001 Encounter for general adult medical examination with abnormal findings: Secondary | ICD-10-CM | POA: Diagnosis not present

## 2023-10-17 DIAGNOSIS — I10 Essential (primary) hypertension: Secondary | ICD-10-CM | POA: Diagnosis not present

## 2023-10-17 DIAGNOSIS — G720 Drug-induced myopathy: Secondary | ICD-10-CM | POA: Diagnosis not present

## 2023-12-26 DIAGNOSIS — M5416 Radiculopathy, lumbar region: Secondary | ICD-10-CM | POA: Diagnosis not present

## 2024-01-03 DIAGNOSIS — I10 Essential (primary) hypertension: Secondary | ICD-10-CM | POA: Diagnosis not present

## 2024-01-19 DIAGNOSIS — R7989 Other specified abnormal findings of blood chemistry: Secondary | ICD-10-CM | POA: Diagnosis not present

## 2024-02-06 ENCOUNTER — Encounter: Payer: Self-pay | Admitting: Cardiovascular Disease

## 2024-02-06 ENCOUNTER — Ambulatory Visit: Attending: Cardiovascular Disease | Admitting: Cardiovascular Disease

## 2024-02-06 VITALS — BP 152/84 | HR 70 | Ht 63.0 in | Wt 168.0 lb

## 2024-02-06 DIAGNOSIS — I10 Essential (primary) hypertension: Secondary | ICD-10-CM | POA: Diagnosis not present

## 2024-02-06 DIAGNOSIS — E782 Mixed hyperlipidemia: Secondary | ICD-10-CM

## 2024-02-06 DIAGNOSIS — I2510 Atherosclerotic heart disease of native coronary artery without angina pectoris: Secondary | ICD-10-CM | POA: Diagnosis not present

## 2024-02-06 DIAGNOSIS — I4510 Unspecified right bundle-branch block: Secondary | ICD-10-CM

## 2024-02-06 HISTORY — DX: Unspecified right bundle-branch block: I45.10

## 2024-02-06 MED ORDER — BISOPROLOL-HYDROCHLOROTHIAZIDE 10-6.25 MG PO TABS: 1.0000 | ORAL_TABLET | Freq: Every day | ORAL | 3 refills | Status: AC

## 2024-02-06 NOTE — Progress Notes (Signed)
 02/06/2024 Anna Gibson   Sep 15, 1946  [id number]  Primary Physician Minus Amel, MD Primary Cardiologist: Avanell Leigh MD Bennye Bravo, FSCAI  HPI:  Anna Gibson is a 78 y.o.   moderately overweight widowed Caucasian female mother of 2, grandmother of 1 grandchild who is accompanied by her son Anna Gibson today.  Unfortunately, her husband of 58 years passed away suddenly 10/26/2023.  She is referred back by Dr. Glady Laming for evaluation of atypical chest pain and palpitations.  I last saw her in the office 08/17/2021.  Risk factors include treated hypertension, untreated hyperlipidemia because of statin intolerance and family history with a brother who has had stents.  She did have a cardiac catheterization performed because of chest pain by myself 09/29/2011 which was entirely normal.  She gets chest pain on a daily basis as well as palpitations.  She does admit to being under a lot of stress.   I did get an event monitor that showed frequent PACs, PVCs, short runs of SVT and nonsustained ventricular tachycardia although her palpitations have improved significantly since I saw her.  Likewise, her chest pain has improved as well.  I did get a coronary CTA that showed a coronary calcium  score of 184 with minimal/nonobstructive CAD.  She was placed on Repatha  which has caused some discomfort in her lower extremities.  I referred her to Dr. Candida Chalk lipid clinic who unfortunately was unable to find a lipid lowering therapy that she could tolerate.  Her most recent lipid profile performed 10/19/2023 revealed total cholesterol 206, LDL 117 and HDL of 59.     Current Meds  Medication Sig   acetaminophen  (TYLENOL ) 650 MG CR tablet Take 650-1,300 mg by mouth daily as needed for pain.    Aloe Vera GEL Apply topically.   ALPRAZolam  (XANAX ) 1 MG tablet Take 1 mg by mouth at bedtime.   Biotin 2500 MCG CAPS Take 1 capsule by mouth daily. Hair,skin,nails and collegen   Cholecalciferol (VITAMIN D-3) 125  MCG (5000 UT) TABS Take 5,000 Units by mouth daily.   famotidine (PEPCID) 20 MG tablet Take 20 mg by mouth daily as needed for heartburn. Takes 6 days out of the week.   KRILL OIL PO Take by mouth daily.   Magnesium  400 MG TABS Take 400 mg by mouth daily.    Multiple Vitamins-Minerals (WOMENS 50+ MULTI VITAMIN/MIN PO) Take 1 tablet by mouth daily.   pantoprazole  (PROTONIX ) 40 MG tablet 1 PO 30 MINS PRIOR TO YOUR FIRST MEAL   Probiotic Product (PROBIOTIC DAILY PO) Take 1 tablet by mouth daily.   psyllium (METAMUCIL) 58.6 % powder Take 1 packet by mouth daily in the afternoon. SUGAR FREE-ORANGE FLAVORED   [DISCONTINUED] bisoprolol-hydrochlorothiazide (ZIAC) 5-6.25 MG tablet Take 1 tablet by mouth daily.     Allergies  Allergen Reactions   Crestor  [Rosuvastatin ]     Sick and myalgias   Gabapentin      Dizzy spacy    Indomethacin Other (See Comments)    "sever headache"   Leqvio  [Inclisiran Sodium ] Other (See Comments)    dizziness   Lipitor [Atorvastatin]     myalgias   Repatha  [Evolocumab ]    Zetia  [Ezetimibe ]     fatigue    Social History   Socioeconomic History   Marital status: Married    Spouse name: Not on file   Number of children: Not on file   Years of education: Not on file   Highest education level: Not on file  Occupational History   Not on file  Tobacco Use   Smoking status: Never   Smokeless tobacco: Never  Vaping Use   Vaping status: Never Used  Substance and Sexual Activity   Alcohol use: No   Drug use: No   Sexual activity: Yes  Other Topics Concern   Not on file  Social History Narrative   Not on file   Social Drivers of Health   Financial Resource Strain: Not on file  Food Insecurity: Not on file  Transportation Needs: Not on file  Physical Activity: Not on file  Stress: Not on file  Social Connections: Not on file  Intimate Partner Violence: Not on file     Review of Systems: General: negative for chills, fever, night sweats or weight  changes.  Cardiovascular: negative for chest pain, dyspnea on exertion, edema, orthopnea, palpitations, paroxysmal nocturnal dyspnea or shortness of breath Dermatological: negative for rash Respiratory: negative for cough or wheezing Urologic: negative for hematuria Abdominal: negative for nausea, vomiting, diarrhea, bright red blood per rectum, melena, or hematemesis Neurologic: negative for visual changes, syncope, or dizziness All other systems reviewed and are otherwise negative except as noted above.    Blood pressure (!) 152/84, pulse 70, height 5\' 3"  (1.6 m), weight 168 lb (76.2 kg), SpO2 98%.  General appearance: alert and no distress Neck: no adenopathy, no carotid bruit, no JVD, supple, symmetrical, trachea midline, and thyroid  not enlarged, symmetric, no tenderness/mass/nodules Lungs: clear to auscultation bilaterally Heart: regular rate and rhythm, S1, S2 normal, no murmur, click, rub or gallop Extremities: extremities normal, atraumatic, no cyanosis or edema Pulses: 2+ and symmetric Skin: Skin color, texture, turgor normal. No rashes or lesions Neurologic: Grossly normal  EKG EKG Interpretation Date/Time:  Tuesday Feb 06 2024 14:01:03 EDT Ventricular Rate:  68 PR Interval:  160 QRS Duration:  110 QT Interval:  426 QTC Calculation: 452 R Axis:   -11  Text Interpretation: Normal sinus rhythm Incomplete right bundle branch block When compared with ECG of 29-Sep-2011 09:29, Incomplete right bundle branch block is now Present Confirmed by Lauro Portal [phone] on 02/06/2024 2:02:48 PM    ASSESSMENT AND PLAN:   HTN (hypertension), though recently low BP History of essential hypertension with blood pressure measured today at 152/84.  She is on low-dose Ziac and amlodipine.  Her blood pressure has been slightly increasing over time.  I am going to double her Ziac dose which apparently in the past has been effective in controlling her blood  pressure.  Hyperlipidemia History of mild hyperlipidemia intolerant to statin drugs and Repatha  as well as Zetia .  Her most recent lipid profile performed 10/19/2023 revealed a total cholesterol of 206, LDL 117 and HDL 59, not at goal for secondary prevention given her elevated coronary calcium  score.  I am going to refer her back to Dr. Candida Chalk lipid clinic for further evaluation.  She may be a candidate for Praluent.  Incomplete right bundle branch block New since prior EKG     Avanell Leigh MD [facility] 02/06/2024 2:18 PM

## 2024-02-06 NOTE — Patient Instructions (Signed)
 Medication Instructions:  - START bisoprolol-hydrochlorothiazide (ZIAC) 10-6.25 MG tablet    *If you need a refill on your cardiac medications before your next appointment, please call your pharmacy*   Lab Work: NONE   If you have labs (blood work) drawn today and your tests are completely normal, you will receive your results only by: MyChart Message (if you have MyChart) OR A paper copy in the mail If you have any lab test that is abnormal or we need to change your treatment, we will call you to review the results.   Testing/Procedures: NONE    Follow-Up: At [facility], you and your health needs are our priority.  As part of our continuing mission to provide you with exceptional heart care, we have created designated Provider Care Teams.  These Care Teams include your primary Cardiologist (physician) and Advanced Practice Providers (APPs -  Physician Assistants and Nurse Practitioners) who all work together to provide you with the care you need, when you need it.  We recommend signing up for the patient portal called "MyChart".  Sign up information is provided on this After Visit Summary.  MyChart is used to connect with patients for Virtual Visits (Telemedicine).  Patients are able to view lab/test results, encounter notes, upcoming appointments, etc.  Non-urgent messages can be sent to your provider as well.   To learn more about what you can do with MyChart, go to [email or web address].    Your next appointment:   6 month(s)  The format for your next appointment:   In Person  Provider:   One of our Advanced Practice Providers (APPs): Melita Springer, PA-C  Friddie Jetty, NP Evaline Hill, NP  Theotis Flake, PA-C Lawana Pray, NP  Willis Harter, PA-C Lovette Rud, PA-C  [place], PA-C Ernest Dick, NP  Marlana Silvan, NP Marcie Sever, PA-C  Laquita Plant, PA-C    Dayna Dunn, PA-C  Scott Weaver, PA-C Palmer Bobo, NP Katlyn West, NP Callie Goodrich, PA-C  Evan  Williams, PA-C Sheng Haley, PA-C  Xika Zhao, NP Kathleen Johnson, PA-C   Then, Lauro Portal, MD  will plan to see you again in 1 year(s).   Other Instructions  Dr. Katheryne Pane has referred you to [facility].

## 2024-02-06 NOTE — Assessment & Plan Note (Signed)
New since prior EKG 

## 2024-02-06 NOTE — Assessment & Plan Note (Signed)
 History of mild hyperlipidemia intolerant to statin drugs and Repatha  as well as Zetia .  Her most recent lipid profile performed 10/19/2023 revealed a total cholesterol of 206, LDL 117 and HDL 59, not at goal for secondary prevention given her elevated coronary calcium  score.  I am going to refer her back to Dr. Candida Chalk lipid clinic for further evaluation.  She may be a candidate for Praluent.

## 2024-02-06 NOTE — Assessment & Plan Note (Signed)
 History of essential hypertension with blood pressure measured today at 152/84.  She is on low-dose Ziac and amlodipine.  Her blood pressure has been slightly increasing over time.  I am going to double her Ziac dose which apparently in the past has been effective in controlling her blood pressure.

## 2024-02-07 ENCOUNTER — Telehealth: Payer: Self-pay | Admitting: Cardiovascular Disease

## 2024-02-07 NOTE — Telephone Encounter (Signed)
 Pt c/o medication issue:  1. Name of Medication: Amlodipine  2. How are you currently taking this medication (dosage and times per day)? N/a  3. Are you having a reaction (difficulty breathing--STAT)? No  4. What is your medication issue? Patient stated she has been taking this medication and noticed it was not listed on her medication list. Patient would like to clarify if she should be taking this medication or not. Please advise.

## 2024-02-07 NOTE — Telephone Encounter (Signed)
 Pt states Dr. Lewayne Records started her on Amlodipine 5 mg on 4/29.  Aware that I will send to Dr. Katheryne Pane for review.  Aware that we will ONLY call back if he has other advisement, or reasoning as to why she should not be on it.  (Have NOT updated her medication list until hearing back from MD).  She reports ankle edema since starting it, seems to be worsening.  Advised pt to call Dr. Lewayne Records to discuss.  Advised to update us  if they make any medication change, so that we may update her medication list.  Forwarding to Dr. Katheryne Pane.  (Please send response to triage)

## 2024-02-20 ENCOUNTER — Encounter (HOSPITAL_COMMUNITY): Payer: Self-pay | Admitting: Emergency Medicine

## 2024-02-20 ENCOUNTER — Other Ambulatory Visit: Payer: Self-pay

## 2024-02-20 ENCOUNTER — Observation Stay (HOSPITAL_COMMUNITY)
Admission: EM | Admit: 2024-02-20 | Discharge: 2024-02-21 | Disposition: A | Discharge status: Home / Self Care | Attending: Internal Medicine | Admitting: Internal Medicine

## 2024-02-20 DIAGNOSIS — N8111 Cystocele, midline: Secondary | ICD-10-CM | POA: Insufficient documentation

## 2024-02-20 DIAGNOSIS — R7989 Other specified abnormal findings of blood chemistry: Secondary | ICD-10-CM | POA: Insufficient documentation

## 2024-02-20 DIAGNOSIS — R0602 Shortness of breath: Secondary | ICD-10-CM | POA: Diagnosis present

## 2024-02-20 DIAGNOSIS — F419 Anxiety disorder, unspecified: Secondary | ICD-10-CM | POA: Insufficient documentation

## 2024-02-20 DIAGNOSIS — R55 Syncope and collapse: Secondary | ICD-10-CM | POA: Diagnosis not present

## 2024-02-20 DIAGNOSIS — K5730 Diverticulosis of large intestine without perforation or abscess without bleeding: Secondary | ICD-10-CM | POA: Insufficient documentation

## 2024-02-20 DIAGNOSIS — I10 Essential (primary) hypertension: Secondary | ICD-10-CM | POA: Diagnosis not present

## 2024-02-20 DIAGNOSIS — K219 Gastro-esophageal reflux disease without esophagitis: Secondary | ICD-10-CM | POA: Diagnosis not present

## 2024-02-20 DIAGNOSIS — K2930 Chronic superficial gastritis without bleeding: Secondary | ICD-10-CM

## 2024-02-20 DIAGNOSIS — D131 Benign neoplasm of stomach: Secondary | ICD-10-CM | POA: Insufficient documentation

## 2024-02-20 HISTORY — DX: Chronic superficial gastritis without bleeding: K29.30

## 2024-02-20 LAB — COMPREHENSIVE METABOLIC PANEL WITH GFR
ALT: 11 U/L (ref 0–44)
AST: 20 U/L (ref 15–41)
Albumin: 3.7 g/dL (ref 3.5–5.0)
Alkaline Phosphatase: 82 U/L (ref 38–126)
Anion gap: 8 (ref 5–15)
BUN: 16 mg/dL (ref 8–23)
CO2: 25 mmol/L (ref 22–32)
Calcium: 9.7 mg/dL (ref 8.9–10.3)
Chloride: 104 mmol/L (ref 98–111)
Creatinine, Ser: 0.99 mg/dL (ref 0.44–1.00)
GFR, Estimated: 59 mL/min — ABNORMAL LOW (ref >60)
Glucose, Bld: 114 mg/dL — ABNORMAL HIGH (ref 70–99)
Potassium: 3.6 mmol/L (ref 3.5–5.1)
Sodium: 137 mmol/L (ref 135–145)
Total Bilirubin: 0.5 mg/dL (ref 0.0–1.2)
Total Protein: 6.5 g/dL (ref 6.5–8.1)

## 2024-02-20 LAB — CBC WITH DIFFERENTIAL/PLATELET
Abs Immature Granulocytes: 0.01 10*3/uL (ref 0.00–0.07)
Basophils Absolute: 0 10*3/uL (ref 0.0–0.1)
Basophils Relative: 1 %
Eosinophils Absolute: 0.1 10*3/uL (ref 0.0–0.5)
Eosinophils Relative: 1 %
HCT: 37.6 % (ref 36.0–46.0)
Hemoglobin: 12.6 g/dL (ref 12.0–15.0)
Immature Granulocytes: 0 %
Lymphocytes Relative: 18 %
Lymphs Abs: 0.9 10*3/uL (ref 0.7–4.0)
MCH: 29.9 pg (ref 26.0–34.0)
MCHC: 33.5 g/dL (ref 30.0–36.0)
MCV: 89.3 fL (ref 80.0–100.0)
Monocytes Absolute: 0.5 10*3/uL (ref 0.1–1.0)
Monocytes Relative: 9 %
Neutro Abs: 3.6 10*3/uL (ref 1.7–7.7)
Neutrophils Relative %: 71 %
Platelets: 261 10*3/uL (ref 150–400)
RBC: 4.21 MIL/uL (ref 3.87–5.11)
RDW: 13 % (ref 11.5–15.5)
WBC: 5 10*3/uL (ref 4.0–10.5)
nRBC: 0 % (ref 0.0–0.2)

## 2024-02-20 LAB — TROPONIN I (HIGH SENSITIVITY)
Troponin I (High Sensitivity): 12 ng/L (ref <18)
Troponin I (High Sensitivity): 25 ng/L — ABNORMAL HIGH (ref <18)

## 2024-02-20 NOTE — ED Notes (Signed)
 Pt ambulatory to bathroom

## 2024-02-20 NOTE — ED Provider Notes (Signed)
 [place] EMERGENCY DEPARTMENT AT [facility] Provider Note  CSN: [id number] Arrival date & time: 02/20/24 1926  Chief Complaint(s) Shortness of Breath  HPI Anna Gibson is a 78 y.o. female with PMH HTN, right bundle branch block who presents emergency department for evaluation of a syncopal episode.  Patient states that she was mowing the lawn on a riding mower when she started to become very lightheaded.  She stopped the riding mower, stepped onto the ground and started to have crushing chest pain and shortness of breath subsequently followed by a syncopal episode.  She awoke otherwise asymptomatic and returns to the emergency department for further evaluation.  Patient states she is very nervous about this event as her husband suffered cardiac arrest while eating breakfast from an arrhythmia.  Here in the emergency room she is asymptomatic denying chest pain, shortness of breath, abdominal pain, nausea, vomiting or other systemic symptoms.   Past Medical History Past Medical History:  Diagnosis Date   Abdominal hernia 05/10/2022   Burning sensation of mouth 08/13/2014   Chest pain 09/29/2011   Atypical chest pain  IMO SNOMED Dx Update Oct 2024     Chronic superficial gastritis 02/20/2024   Dysphagia 01/11/2012   GERD (gastroesophageal reflux disease)    Hemorrhoids 05/06/2020   HTN (hypertension), though recently low BP 09/29/2011   Hypertension    Incomplete right bundle branch block 02/06/2024   Incomplete right bundle branch block     Palpitations 02/23/2021   Palpitations     Vitamin D deficiency    Patient Active Problem List   Diagnosis Date Noted   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   Chronic superficial gastritis 02/20/2024   Diverticular disease of colon 02/20/2024   Midline cystocele 02/20/2024   Incomplete right bundle branch block 02/06/2024   Pain of left heel 05/30/2022   Abdominal hernia 05/10/2022   Irritable bowel syndrome 05/10/2022    Myalgia due to statin 04/22/2021   Hyperlipidemia 02/23/2021   Palpitations 02/23/2021   Hemorrhoids 05/06/2020   Rectal bleeding 12/14/2016   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 Authorizing Provider  acetaminophen  (TYLENOL ) 650 MG CR tablet Take 650-1,300 mg by mouth daily as needed for pain.    Yes [provider]  ALPRAZolam  (XANAX ) 1 MG tablet Take 1 mg by mouth at bedtime. 01/01/20  Yes [provider]  bisoprolol -hydrochlorothiazide  (ZIAC ) 10-6.25 MG tablet Take 1 tablet by mouth daily. 02/06/24  Yes Avanell Leigh, MD  Cholecalciferol (VITAMIN D-3) 125 MCG (5000 UT) TABS Take 5,000 Units by mouth daily.   Yes [provider]  Magnesium  400 MG TABS Take 400 mg by mouth daily.    Yes [provider]  pantoprazole  (PROTONIX ) 40 MG tablet 1 PO 30 MINS PRIOR TO YOUR FIRST MEAL Patient taking differently: Take 40 mg by mouth daily. 1 PO 30 MINS PRIOR TO YOUR FIRST MEAL 04/30/19  Yes Harper, Kristen S, PA-C  Probiotic Product (PROBIOTIC DAILY PO) Take 1 tablet by mouth daily.   Yes [provider]  psyllium (METAMUCIL) 58.6 % powder Take 1 packet by mouth daily in the afternoon. SUGAR FREE-ORANGE FLAVORED   Yes [provider]  Past Surgical History Past Surgical History:  Procedure Laterality Date   BIOPSY  09/21/2018   Procedure: BIOPSY;  Surgeon: Suzette Espy, MD;  Location: [facility];  Service: Endoscopy;;  gastric   CARDIAC CATHETERIZATION     CESAREAN SECTION     CESAREAN  SECTION     CHOLECYSTECTOMY     COLONOSCOPY  08/14/2007   Rare sigmoid colon diverticula.  Random colon biopsies benign.  Repeat colonoscopy in 10 years.   ESOPHAGOGASTRODUODENOSCOPY  08/14/2007    2 cm hiatal hernia, multiple sessile gastric polyps in the fundus, multiple erosions in the antrum, normal duodenum/Gastritis is the likely cause for her abdominal pain.  No H. pylori or celiac sprue.   ESOPHAGOGASTRODUODENOSCOPY N/A 09/21/2018   Dr. Riley Cheadle: mild erosive reflux esophagitis, few gastric polyps s/p resection and retrieval, normal duodenum, fundic gland polyp   FLEXIBLE SIGMOIDOSCOPY N/A 12/30/2016   non-thrombosed external hemorrhoids, redundant rectosigmoid colon, internal hemorrhoids   LEFT HEART CATHETERIZATION WITH CORONARY ANGIOGRAM N/A 09/29/2011   Procedure: LEFT HEART CATHETERIZATION WITH CORONARY ANGIOGRAM;  Surgeon: Avanell Leigh, MD;  Location: [facility] C[facility] LAB;  Service: Cardiovascular;  Laterality: N/A;   Family History Family History  Problem Relation Age of Onset   Congenital heart disease Mother    Coronary artery disease Brother    Colon cancer Neg Hx     Social History Social History   Tobacco Use   Smoking status: Never   Smokeless tobacco: Never  Vaping Use   Vaping status: Never Used  Substance Use Topics   Alcohol use: No   Drug use: No   Allergies Gabapentin , Indomethacin, Repatha  [evolocumab ], Crestor  [rosuvastatin ], Leqvio  [inclisiran sodium ], Lipitor [atorvastatin], and Zetia  [ezetimibe ]  Review of Systems Review of Systems  Respiratory:  Positive for shortness of breath.   Cardiovascular:  Positive for chest pain.  Neurological:  Positive for syncope.    Physical Exam Vital Signs  I have reviewed the triage vital signs BP (!) 143/75   Pulse 79   Temp 98 F (36.7 C) (Oral)   Resp 15   Ht 5\' 3"  (1.6 m)   Wt 76.2 kg   SpO2 96%   BMI 29.76 kg/m   Physical Exam Vitals and nursing note reviewed.  Constitutional:      General: She is  not in acute distress.    Appearance: She is well-developed.  HENT:     Head: Normocephalic and atraumatic.  Eyes:     Conjunctiva/sclera: Conjunctivae normal.  Cardiovascular:     Rate and Rhythm: Normal rate and regular rhythm.     Heart sounds: No murmur heard. Pulmonary:     Effort: Pulmonary effort is normal. No respiratory distress.     Breath sounds: Normal breath sounds.  Abdominal:     Palpations: Abdomen is soft.     Tenderness: There is no abdominal tenderness.  Musculoskeletal:        General: No swelling.     Cervical back: Neck supple.  Skin:    General: Skin is warm and dry.     Capillary Refill: Capillary refill takes less than 2 seconds.  Neurological:     Mental Status: She is alert.  Psychiatric:        Mood and Affect: Mood normal.     ED Results and Treatments Labs (all labs ordered are listed, but only abnormal results are displayed) Labs Reviewed  COMPREHENSIVE METABOLIC PANEL WITH GFR - Abnormal; Notable for the following components:  Result Value   Glucose, Bld 114 (*)    GFR, Estimated 59 (*)    All other components within normal limits  CBC WITH DIFFERENTIAL/PLATELET  TROPONIN I (HIGH SENSITIVITY)  TROPONIN I (HIGH SENSITIVITY)                                                                                                                          Radiology No results found.  Pertinent labs & imaging results that were available during my care of the patient were reviewed by me and considered in my medical decision making (see MDM for details).  Medications Ordered in ED Medications - No data to display                                                                                                                                   Procedures Procedures  (including critical care time)  Medical Decision Making / ED Course   This patient presents to the ED for concern of syncope, this involves an extensive number of treatment  options, and is a complaint that carries with it a high risk of complications and morbidity.  The differential diagnosis includes orthostatic syncope, cardiogenic syncope, vasovagal syncope, electrolyte abnormality, dehydration, dysrhythmia, vasovagal, Hypoglycemia, Seizure, Autonomic Insufficiency  MDM: Patient seen emergency room for evaluation of syncope.  Physical exam is unremarkable.  Laboratory duration is overall reassuring including initial high-sensitivity troponin but delta troponin is rising to 25.  ECG showing a right bundle branch block but no evidence of WPW or Brugada, no evidence of ischemia.  Patient story is concerning given chest pain, shortness of breath and syncope and I do feel the patient would benefit from overnight telemetry observation with echocardiogram in the morning.  Patient then admitted to the hospitalist for high risk syncope.   Additional history obtained: -Additional history obtained from multiple family members -External records from outside source obtained and reviewed including: Chart review including previous notes, labs, imaging, consultation notes   Lab Tests: -I ordered, reviewed, and interpreted labs.   The pertinent results include:   Labs Reviewed  COMPREHENSIVE METABOLIC PANEL WITH GFR - Abnormal; Notable for the following components:      Result Value   Glucose, Bld 114 (*)    GFR, Estimated 59 (*)    All other components within normal limits  CBC WITH DIFFERENTIAL/PLATELET  TROPONIN I (HIGH SENSITIVITY)  TROPONIN I (  HIGH SENSITIVITY)      EKG   EKG Interpretation Date/Time:  Tuesday February 20 2024 19:42:00 EDT Ventricular Rate:  83 PR Interval:  158 QRS Duration:  120 QT Interval:  398 QTC Calculation: 468 R Axis:   36  Text Interpretation: Sinus rhythm IVCD, consider atypical RBBB Confirmed by Arjuna Doeden ([id number]) on 02/20/2024 8:05:12 PM         Medicines ordered and prescription drug management: No orders of the defined  types were placed in this encounter.   -I have reviewed the patients home medicines and have made adjustments as needed  Critical interventions none    Cardiac Monitoring: The patient was maintained on a cardiac monitor.  I personally viewed and interpreted the cardiac monitored which showed an underlying rhythm of: NSr  Social Determinants of Health:  Factors impacting patients care include: none   Reevaluation: After the interventions noted above, I reevaluated the patient and found that they have :stayed the same  Co morbidities that complicate the patient evaluation  Past Medical History:  Diagnosis Date   Abdominal hernia 05/10/2022   Burning sensation of mouth 08/13/2014   Chest pain 09/29/2011   Atypical chest pain  IMO SNOMED Dx Update Oct 2024     Chronic superficial gastritis 02/20/2024   Dysphagia 01/11/2012   GERD (gastroesophageal reflux disease)    Hemorrhoids 05/06/2020   HTN (hypertension), though recently low BP 09/29/2011   Hypertension    Incomplete right bundle branch block 02/06/2024   Incomplete right bundle branch block     Palpitations 02/23/2021   Palpitations     Vitamin D deficiency       Dispostion: I considered admission for this patient, and patient require hospital admission for high risk syncope.     Final Clinical Impression(s) / ED Diagnoses Final diagnoses:  None     @PCDICTATION @    Karlyn Overman, MD 02/21/24 [phone]

## 2024-02-20 NOTE — ED Triage Notes (Signed)
 Pt arrives to Ed w/ family c/o mid sternal chest pain and shortness of breath. Pt states she was outside mowing the yard and suddenly felt weird and fell off the mower. Pt states everything went dark for a few minutes then she was able to crawl to the porch.

## 2024-02-20 NOTE — H&P (Signed)
 History and Physical    Patient: Anna Gibson ZOX:[id number] DOB: 1946/01/02 DOA: 02/20/2024 DOS: the patient was seen and examined on 02/21/2024 PCP: Minus Amel, MD  Patient coming from: Home  Chief Complaint:  Chief Complaint  Patient presents with   Shortness of Breath   HPI: Anna Gibson is a 78 y.o. female with medical history significant of hypertension, GERD who presents to the emergency department due to shortness of breath and midsternal chest pain.  Patient states that she was mowing her yard with a lawnmower when she suddenly became dizzy and fell off the lower and passed out for few minutes, on regaining consciousness, she was able to crawl to the porch.  ED Course:  In the emergency department, she was hemodynamically stable, BP was1/81, workup in the ED showed normal CBC and BMP except for blood glucose of 114, troponin 12 > 25.  Hospitalist was asked to admit patient for further evaluation and management   Review of Systems: Review of systems as noted in the HPI. All other systems reviewed and are negative.   Past Medical History:  Diagnosis Date   Abdominal hernia 05/10/2022   Burning sensation of mouth 08/13/2014   Chest pain 09/29/2011   Atypical chest pain  IMO SNOMED Dx Update Oct 2024     Chronic superficial gastritis 02/20/2024   Dysphagia 01/11/2012   GERD (gastroesophageal reflux disease)    Hemorrhoids 05/06/2020   HTN (hypertension), though recently low BP 09/29/2011   Hypertension    Incomplete right bundle branch block 02/06/2024   Incomplete right bundle branch block     Palpitations 02/23/2021   Palpitations     Vitamin D deficiency    Past Surgical History:  Procedure Laterality Date   BIOPSY  09/21/2018   Procedure: BIOPSY;  Surgeon: Suzette Espy, MD;  Location: [facility];  Service: Endoscopy;;  gastric   CARDIAC CATHETERIZATION     CESAREAN SECTION     CESAREAN SECTION     CHOLECYSTECTOMY     COLONOSCOPY  08/14/2007   Rare  sigmoid colon diverticula.  Random colon biopsies benign.  Repeat colonoscopy in 10 years.   ESOPHAGOGASTRODUODENOSCOPY  08/14/2007    2 cm hiatal hernia, multiple sessile gastric polyps in the fundus, multiple erosions in the antrum, normal duodenum/Gastritis is the likely cause for her abdominal pain.  No H. pylori or celiac sprue.   ESOPHAGOGASTRODUODENOSCOPY N/A 09/21/2018   Dr. Riley Cheadle: mild erosive reflux esophagitis, few gastric polyps s/p resection and retrieval, normal duodenum, fundic gland polyp   FLEXIBLE SIGMOIDOSCOPY N/A 12/30/2016   non-thrombosed external hemorrhoids, redundant rectosigmoid colon, internal hemorrhoids   LEFT HEART CATHETERIZATION WITH CORONARY ANGIOGRAM N/A 09/29/2011   Procedure: LEFT HEART CATHETERIZATION WITH CORONARY ANGIOGRAM;  Surgeon: Avanell Leigh, MD;  Location: [facility] CATH LAB;  Service: Cardiovascular;  Laterality: N/A;    Social History:  reports that she has never smoked. She has never used smokeless tobacco. She reports that she does not drink alcohol and does not use drugs.   Allergies  Allergen Reactions   Gabapentin  Other (See Comments)    Dizzy spacy    Indomethacin Other (See Comments)    "Severe headache"   Repatha  [Evolocumab ] Other (See Comments)    Unknown    Crestor  [Rosuvastatin ] Other (See Comments)    Sick and myalgias   Leqvio  [Inclisiran Sodium ] Other (See Comments)    Dizziness    Lipitor [Atorvastatin] Other (See Comments)    Myalgias  Zetia  [Ezetimibe ] Other (See Comments)    Fatigue     Family History  Problem Relation Age of Onset   Congenital heart disease Mother    Coronary artery disease Brother    Colon cancer Neg Hx      Prior to Admission medications   Medication Sig Start Date End Date Taking? Authorizing Provider  acetaminophen  (TYLENOL ) 650 MG CR tablet Take 650-1,300 mg by mouth daily as needed for pain.    Yes [provider]  ALPRAZolam  (XANAX ) 1 MG tablet Take 1 mg by mouth at bedtime.  01/01/20  Yes [provider]  bisoprolol -hydrochlorothiazide  (ZIAC ) 10-6.25 MG tablet Take 1 tablet by mouth daily. 02/06/24  Yes Avanell Leigh, MD  Cholecalciferol (VITAMIN D-3) 125 MCG (5000 UT) TABS Take 5,000 Units by mouth daily.   Yes [provider]  Magnesium  400 MG TABS Take 400 mg by mouth daily.    Yes [provider]  pantoprazole  (PROTONIX ) 40 MG tablet 1 PO 30 MINS PRIOR TO YOUR FIRST MEAL Patient taking differently: Take 40 mg by mouth daily. 1 PO 30 MINS PRIOR TO YOUR FIRST MEAL 04/30/19  Yes Harper, Kristen S, PA-C  Probiotic Product (PROBIOTIC DAILY PO) Take 1 tablet by mouth daily.   Yes [provider]  psyllium (METAMUCIL) 58.6 % powder Take 1 packet by mouth daily in the afternoon. SUGAR FREE-ORANGE FLAVORED   Yes [provider]    Physical Exam: BP (!) 159/79 (BP Location: Right Arm)   Pulse 72   Temp 97.8 F (36.6 C) (Oral)   Resp 16   Ht 5\' 3"  (1.6 m)   Wt 76 kg   SpO2 98%   BMI 29.68 kg/m   General: 78 y.o. year-old female well developed well nourished in no acute distress.  Alert and oriented x3. HEENT: NCAT, EOMI Neck: Supple, trachea medial Cardiovascular: Regular rate and rhythm with no rubs or gallops.  No thyromegaly or JVD noted.  No lower extremity edema. 2/4 pulses in all 4 extremities. Respiratory: Clear to auscultation with no wheezes or rales. Good inspiratory effort. Abdomen: Soft, nontender nondistended with normal bowel sounds x4 quadrants. Muskuloskeletal: No cyanosis, clubbing or edema noted bilaterally Neuro: CN II-XII intact, strength 5/5 x 4, sensation, reflexes intact Skin: No ulcerative lesions noted or rashes Psychiatry: Judgement and insight appear normal. Mood is appropriate for condition and setting          Labs on Admission:  Basic Metabolic Panel: Recent Labs  Lab 02/20/24 2024  NA 137  K 3.6  CL 104  CO2 25  GLUCOSE 114*  BUN 16  CREATININE 0.99  CALCIUM  9.7   Liver  Function Tests: Recent Labs  Lab 02/20/24 2024  AST 20  ALT 11  ALKPHOS 82  BILITOT 0.5  PROT 6.5  ALBUMIN 3.7   No results for input(s): "LIPASE", "AMYLASE" in the last 168 hours. No results for input(s): "AMMONIA" in the last 168 hours. CBC: Recent Labs  Lab 02/20/24 2024  WBC 5.0  NEUTROABS 3.6  HGB 12.6  HCT 37.6  MCV 89.3  PLT 261   Cardiac Enzymes: No results for input(s): "CKTOTAL", "CKMB", "CKMBINDEX", "TROPONINI" in the last 168 hours.  BNP (last 3 results) No results for input(s): "BNP" in the last 8760 hours.  ProBNP (last 3 results) No results for input(s): "PROBNP" in the last 8760 hours.  CBG: No results for input(s): "GLUCAP" in the last 168 hours.  Radiological Exams on Admission: No results found.  EKG: I independently viewed the EKG done and my findings are as followed: Normal sinus rhythm at rate of 83 bpm  Assessment/Plan Present on Admission:  Syncope  Essential hypertension  GERD (gastroesophageal reflux disease)  Principal Problem:   Syncope Active Problems:   Essential hypertension   GERD (gastroesophageal reflux disease)   Elevated troponin  Syncope Elevated troponin Continue telemetry and watch for arrhythmias Troponins 12 > 25 ; continue to trend troponin EKG showed normal sinus rhythm at a rate of 83 bpm Patient was noted to have a long-term monitor for about 2 weeks in June 2022 which showed sinus rhythm/sinus tachycardia/sinus bradycardia.  Frequent PACs and PVCs.  Short runs of SVT and nonsustained V. tach Echocardiogram will be done to rule out significant aortic stenosis or other outflow obstruction, and also to evaluate EF and to rule out segmental/Regional wall motion abnormalities.  Carotid artery Dopplers will be done to rule out hemodynamically significant stenosis  Essential hypertension Continue Ziac   GERD Continue Protonix    DVT prophylaxis: Lovenox  Family Communication: None at bedside   Advance Care  Planning:   Code Status: Full Code   Consults: None  Severity of Illness: The appropriate patient status for this patient is OBSERVATION. Observation status is judged to be reasonable and necessary in order to provide the required intensity of service to ensure the patient's safety. The patient's presenting symptoms, physical exam findings, and initial radiographic and laboratory data in the context of their medical condition is felt to place them at decreased risk for further clinical deterioration. Furthermore, it is anticipated that the patient will be medically stable for discharge from the hospital within 2 midnights of admission.   Author: Nakshatra Klose, DO 02/21/2024 1:29 AM  For on call review [email or web address].

## 2024-02-20 NOTE — ED Notes (Signed)
 ED Provider at bedside.

## 2024-02-21 ENCOUNTER — Observation Stay (HOSPITAL_COMMUNITY)

## 2024-02-21 ENCOUNTER — Other Ambulatory Visit (HOSPITAL_COMMUNITY): Payer: Self-pay | Admitting: *Deleted

## 2024-02-21 DIAGNOSIS — R55 Syncope and collapse: Secondary | ICD-10-CM | POA: Diagnosis not present

## 2024-02-21 DIAGNOSIS — I10 Essential (primary) hypertension: Secondary | ICD-10-CM | POA: Diagnosis not present

## 2024-02-21 DIAGNOSIS — R7989 Other specified abnormal findings of blood chemistry: Secondary | ICD-10-CM | POA: Diagnosis not present

## 2024-02-21 DIAGNOSIS — E785 Hyperlipidemia, unspecified: Secondary | ICD-10-CM | POA: Diagnosis not present

## 2024-02-21 DIAGNOSIS — K219 Gastro-esophageal reflux disease without esophagitis: Secondary | ICD-10-CM | POA: Diagnosis not present

## 2024-02-21 DIAGNOSIS — I6523 Occlusion and stenosis of bilateral carotid arteries: Secondary | ICD-10-CM | POA: Diagnosis not present

## 2024-02-21 LAB — MAGNESIUM: Magnesium: 2 mg/dL (ref 1.7–2.4)

## 2024-02-21 LAB — COMPREHENSIVE METABOLIC PANEL WITH GFR
ALT: 9 U/L (ref 0–44)
AST: 16 U/L (ref 15–41)
Albumin: 3.5 g/dL (ref 3.5–5.0)
Alkaline Phosphatase: 76 U/L (ref 38–126)
Anion gap: 7 (ref 5–15)
BUN: 14 mg/dL (ref 8–23)
CO2: 27 mmol/L (ref 22–32)
Calcium: 9.5 mg/dL (ref 8.9–10.3)
Chloride: 105 mmol/L (ref 98–111)
Creatinine, Ser: 0.86 mg/dL (ref 0.44–1.00)
GFR, Estimated: >60 mL/min (ref >60)
Glucose, Bld: 105 mg/dL — ABNORMAL HIGH (ref 70–99)
Potassium: 3.7 mmol/L (ref 3.5–5.1)
Sodium: 139 mmol/L (ref 135–145)
Total Bilirubin: 0.4 mg/dL (ref 0.0–1.2)
Total Protein: 6 g/dL — ABNORMAL LOW (ref 6.5–8.1)

## 2024-02-21 LAB — CBC
HCT: 36.1 % (ref 36.0–46.0)
Hemoglobin: 11.6 g/dL — ABNORMAL LOW (ref 12.0–15.0)
MCH: 29.1 pg (ref 26.0–34.0)
MCHC: 32.1 g/dL (ref 30.0–36.0)
MCV: 90.5 fL (ref 80.0–100.0)
Platelets: 247 10*3/uL (ref 150–400)
RBC: 3.99 MIL/uL (ref 3.87–5.11)
RDW: 13 % (ref 11.5–15.5)
WBC: 5.1 10*3/uL (ref 4.0–10.5)
nRBC: 0 % (ref 0.0–0.2)

## 2024-02-21 LAB — PHOSPHORUS: Phosphorus: 3.4 mg/dL (ref 2.5–4.6)

## 2024-02-21 LAB — ECHOCARDIOGRAM COMPLETE
Area-P 1/2: 2.73 cm2
Height: 63 in
MV M vel: 4.61 m/s
MV Peak grad: 85 mmHg
S' Lateral: 2.9 cm
Weight: 2680.79 [oz_av]

## 2024-02-21 LAB — TROPONIN I (HIGH SENSITIVITY)
Troponin I (High Sensitivity): 30 ng/L — ABNORMAL HIGH (ref <18)
Troponin I (High Sensitivity): 29 ng/L — ABNORMAL HIGH (ref <18)

## 2024-02-21 MED ORDER — BISOPROLOL FUMARATE 5 MG PO TABS
10.0000 mg | ORAL_TABLET | Freq: Every day | ORAL | Status: DC
  Administered 2024-02-21: 10 mg via ORAL
  Filled 2024-02-21 (×2): qty 2

## 2024-02-21 MED ORDER — BISOPROLOL-HYDROCHLOROTHIAZIDE 10-6.25 MG PO TABS
1.0000 | ORAL_TABLET | Freq: Every day | ORAL | Status: DC
  Filled 2024-02-21 (×2): qty 1

## 2024-02-21 MED ORDER — ONDANSETRON HCL 4 MG PO TABS: 4.0000 mg | ORAL_TABLET | Freq: Four times a day (QID) | ORAL | Status: DC | PRN

## 2024-02-21 MED ORDER — ACETAMINOPHEN 650 MG RE SUPP: 650.0000 mg | Freq: Four times a day (QID) | RECTAL | Status: DC | PRN

## 2024-02-21 MED ORDER — ACETAMINOPHEN 325 MG PO TABS: 650.0000 mg | ORAL_TABLET | Freq: Four times a day (QID) | ORAL | Status: DC | PRN

## 2024-02-21 MED ORDER — HYDROCHLOROTHIAZIDE 12.5 MG PO TABS
6.2500 mg | ORAL_TABLET | Freq: Every day | ORAL | Status: DC
  Administered 2024-02-21: 6.25 mg via ORAL
  Filled 2024-02-21 (×2): qty 1

## 2024-02-21 MED ORDER — ENSURE PLUS HIGH PROTEIN PO LIQD: 237.0000 mL | Freq: Two times a day (BID) | ORAL | Status: DC

## 2024-02-21 MED ORDER — PANTOPRAZOLE SODIUM 40 MG PO TBEC
40.0000 mg | DELAYED_RELEASE_TABLET | Freq: Every day | ORAL | Status: DC
  Administered 2024-02-21: 40 mg via ORAL
  Filled 2024-02-21: qty 1

## 2024-02-21 MED ORDER — ENOXAPARIN SODIUM 40 MG/0.4ML IJ SOSY
40.0000 mg | PREFILLED_SYRINGE | INTRAMUSCULAR | Status: DC
  Administered 2024-02-21: 40 mg via SUBCUTANEOUS
  Filled 2024-02-21: qty 0.4

## 2024-02-21 MED ORDER — ONDANSETRON HCL 4 MG/2ML IJ SOLN
4.0000 mg | Freq: Four times a day (QID) | INTRAMUSCULAR | Status: DC | PRN
Start: 2024-02-21 — End: 2024-02-21

## 2024-02-21 NOTE — Discharge Summary (Signed)
 Physician Discharge Summary   Patient: Anna Gibson MRN: [id number] DOB: [birth year]  Admit date:     02/20/2024  Discharge date: 02/21/24  Discharge Physician: Justina Oman   PCP: Minus Amel, MD   Recommendations at discharge:  Repeat basic metabolic panel to follow electrolytes renal function Make sure patient follow-up with cardiology service as instructed Reassess blood pressure and adjust medication as needed.  Discharge Diagnoses: Principal Problem:   Syncope Active Problems:   Essential hypertension   GERD (gastroesophageal reflux disease)   Elevated troponin  Brief Hospital admission narrative: As per H&P written by Dr. Elyse Hand on 02/20/2024 JUDETH GILLES is a 78 y.o. female with medical history significant of hypertension, GERD who presents to the emergency department due to shortness of breath and midsternal chest pain.  Patient states that she was mowing her yard with a lawnmower when she suddenly became dizzy and fell off the lower and passed out for few minutes, on regaining consciousness, she was able to crawl to the porch.   ED Course:  In the emergency department, she was hemodynamically stable, BP was1/81, workup in the ED showed normal CBC and BMP except for blood glucose of 114, troponin 12 > 25.  Hospitalist was asked to admit patient for further evaluation and management   Assessment and Plan:  1-syncope - Negative orthostatic vital signs after fluid resuscitation provided - Telemetry without acute abnormalities - 2D echo reassuring with preserved ejection fraction, no wall motion abnormalities and no significant valvular disorder - Patient expressed intermittent episodes of palpitations and some of the symptoms leading to blackout events. - Event monitoring for 30 days has been requested - Outpatient follow-up with cardiology service recommended. - Completely asymptomatic and eager to go home at time of discharge.  2-hypertension - Continue treatment  with bisoprolol  and HCTZ - Low-sodium diet discussed with patient.  3-GERD - Continue PPI.  4-history of anxiety - Continue as needed alprazolam   Consultants: None Procedures performed: See below for x-ray reports; 2D echo. Disposition: Home Diet recommendation: Heart healthy diet.  DISCHARGE MEDICATION: Allergies as of 02/21/2024       Reactions   Gabapentin  Other (See Comments)   Dizzy spacy    Indomethacin Other (See Comments)   "Severe headache"   Repatha  [evolocumab ] Other (See Comments)   Unknown    Crestor  [rosuvastatin ] Other (See Comments)   Sick and myalgias   Leqvio  [inclisiran Sodium ] Other (See Comments)   Dizziness    Lipitor [atorvastatin] Other (See Comments)   Myalgias    Zetia  [ezetimibe ] Other (See Comments)   Fatigue         Medication List     TAKE these medications    acetaminophen  650 MG CR tablet Commonly known as: TYLENOL  Take 650-1,300 mg by mouth daily as needed for pain.   ALPRAZolam  1 MG tablet Commonly known as: XANAX  Take 1 mg by mouth at bedtime.   bisoprolol -hydrochlorothiazide  10-6.25 MG tablet Commonly known as: Ziac  Take 1 tablet by mouth daily.   Magnesium  400 MG Tabs Take 400 mg by mouth daily.   pantoprazole  40 MG tablet Commonly known as: PROTONIX  1 PO 30 MINS PRIOR TO YOUR FIRST MEAL What changed:  how much to take how to take this when to take this   PROBIOTIC DAILY PO Take 1 tablet by mouth daily.   psyllium 58.6 % powder Commonly known as: METAMUCIL Take 1 packet by mouth daily in the afternoon. SUGAR FREE-ORANGE FLAVORED   Vitamin D-3 125 MCG (  5000 UT) Tabs Take 5,000 Units by mouth daily.        Follow-up Information     Minus Amel, MD. Schedule an appointment as soon as possible for a visit in 10 day(s).   Specialty: Family Medicine Contact information: [address] [place] [address] [phone]         Laurann Pollock, MD. Schedule an appointment as soon as  possible for a visit in 3 week(s).   Specialty: Cardiology Why: Outpatient follow-up in 3-4 weeks after event monitoring has been completed. Contact information: [address] [place] [address] [phone]                Discharge Exam: Cleavon Curls Weights   02/20/24 1936 02/20/24 2333  Weight: 76.2 kg 76 kg   General exam: Alert, awake, oriented x 3 Respiratory system: Clear to auscultation. Respiratory effort normal. Cardiovascular system:RRR. No murmurs, rubs, gallops. Gastrointestinal system: Abdomen is nondistended, soft and nontender. No organomegaly or masses felt. Normal bowel sounds heard. Central nervous system: Alert and oriented. No focal neurological deficits. Extremities: No C/C/E, +pedal pulses Skin: No rashes, lesions or ulcers Psychiatry: Judgement and insight appear normal. Mood & affect appropriate.    Condition at discharge: Stable and improved.  The results of significant diagnostics from this hospitalization (including imaging, microbiology, ancillary and laboratory) are listed below for reference.   Imaging Studies: ECHOCARDIOGRAM COMPLETE Result Date: 02/21/2024    ECHOCARDIOGRAM REPORT   Patient Name:   Anna Gibson Date of Exam: 02/21/2024 Medical Rec #:  [id number]     Height:       63.0 in Accession #:    [id number]    Weight:       167.5 lb Date of Birth:  Jun 10, 1946    BSA:          1.793 m Patient Age:    77 years      BP:           134/69 mmHg Patient Gender: F             HR:           62 bpm. Exam Location:  [facility] Procedure: 2D Echo, Cardiac Doppler and Color Doppler (Both Spectral and Color            Flow Doppler were utilized during procedure). Indications:    Syncope R55  History:        Patient has no prior history of Echocardiogram examinations.                 Arrythmias:RBBB, Signs/Symptoms:Syncope; Risk                 Factors:Hypertension and Dyslipidemia.  Sonographer:    Denese Finn RCS Referring Phys: [id number] OLADAPO ADEFESO  IMPRESSIONS  1. Left ventricular ejection fraction, by estimation, is 60 to 65%. The left ventricle has normal function. The left ventricle has no regional wall motion abnormalities. Left ventricular diastolic parameters were normal.  2. Right ventricular systolic function is normal. The right ventricular size is normal. Tricuspid regurgitation signal is inadequate for assessing PA pressure.  3. The mitral valve is normal in structure. Mild mitral valve regurgitation. No evidence of mitral stenosis.  4. The aortic valve is tricuspid. There is mild calcification of the aortic valve. There is mild thickening of the aortic valve. Aortic valve regurgitation is not visualized. No aortic stenosis is present.  5. The inferior vena cava is normal in size with  greater than 50% respiratory variability, suggesting right atrial pressure of 3 mmHg. FINDINGS  Left Ventricle: Left ventricular ejection fraction, by estimation, is 60 to 65%. The left ventricle has normal function. The left ventricle has no regional wall motion abnormalities. The left ventricular internal cavity size was normal in size. There is  no left ventricular hypertrophy. Left ventricular diastolic parameters were normal. Right Ventricle: The right ventricular size is normal. Right vetricular wall thickness was not well visualized. Right ventricular systolic function is normal. Tricuspid regurgitation signal is inadequate for assessing PA pressure. Left Atrium: Left atrial size was normal in size. Right Atrium: Right atrial size was normal in size. Pericardium: There is no evidence of pericardial effusion. Mitral Valve: The mitral valve is normal in structure. Mild mitral valve regurgitation. No evidence of mitral valve stenosis. Tricuspid Valve: The tricuspid valve is normal in structure. Tricuspid valve regurgitation is trivial. No evidence of tricuspid stenosis. Aortic Valve: The aortic valve is tricuspid. There is mild calcification of the aortic valve.  There is mild thickening of the aortic valve. There is mild aortic valve annular calcification. Aortic valve regurgitation is not visualized. No aortic stenosis  is present. Pulmonic Valve: The pulmonic valve was not well visualized. Pulmonic valve regurgitation is not visualized. No evidence of pulmonic stenosis. Aorta: The aortic root is normal in size and structure. Venous: The inferior vena cava is normal in size with greater than 50% respiratory variability, suggesting right atrial pressure of 3 mmHg. IAS/Shunts: No atrial level shunt detected by color flow Doppler.  LEFT VENTRICLE PLAX 2D LVIDd:         4.70 cm   Diastology LVIDs:         2.90 cm   LV e' medial:    6.96 cm/s LV PW:         0.90 cm   LV E/e' medial:  10.8 LV IVS:        0.80 cm   LV e' lateral:   9.03 cm/s LVOT diam:     2.10 cm   LV E/e' lateral: 8.3 LV SV:         68 LV SV Index:   38 LVOT Area:     3.46 cm  RIGHT VENTRICLE RV S prime:     13.20 cm/s TAPSE (M-mode): 2.4 cm LEFT ATRIUM             Index        RIGHT ATRIUM           Index LA diam:        3.70 cm 2.06 cm/m   RA Area:     20.40 cm LA Vol (A2C):   62.0 ml 34.57 ml/m  RA Volume:   61.70 ml  34.40 ml/m LA Vol (A4C):   53.7 ml 29.94 ml/m LA Biplane Vol: 57.6 ml 32.12 ml/m  AORTIC VALVE LVOT Vmax:   89.30 cm/s LVOT Vmean:  56.600 cm/s LVOT VTI:    0.195 m  AORTA Ao Root diam: 3.70 cm MITRAL VALVE MV Area (PHT): 2.73 cm    SHUNTS MV Decel Time: 278 msec    Systemic VTI:  0.20 m MR Peak grad: 85.0 mmHg    Systemic Diam: 2.10 cm MR Mean grad: 60.0 mmHg MR Vmax:      461.00 cm/s MR Vmean:     374.0 cm/s MV E velocity: 75.00 cm/s MV A velocity: 46.80 cm/s MV E/A ratio:  1.60 Armida Lander MD Electronically signed by Armida Lander  MD Signature Date/Time: 02/21/2024/4:01:40 PM    Final    US  Carotid Bilateral Result Date: 02/21/2024 CLINICAL DATA:  Syncope, hyperlipidemia EXAM: BILATERAL CAROTID DUPLEX ULTRASOUND TECHNIQUE: Martina Sledge scale imaging, color Doppler and duplex ultrasound  were performed of bilateral carotid and vertebral arteries in the neck. COMPARISON:  None Available. FINDINGS: Criteria: Quantification of carotid stenosis is based on velocity parameters that correlate the residual internal carotid diameter with NASCET-based stenosis levels, using the diameter of the distal internal carotid lumen as the denominator for stenosis measurement. The following velocity measurements were obtained: RIGHT ICA: 78/20 cm/sec CCA: 85/18 cm/sec SYSTOLIC ICA/CCA RATIO:  1.1 ECA: 117 cm/sec LEFT ICA: 79/21 cm/sec CCA: 72/17 cm/sec SYSTOLIC ICA/CCA RATIO:  1.1 ECA: 89 cm/sec RIGHT CAROTID ARTERY: Mild intimal thickening in the common carotid. Eccentric minimally calcified plaque in the bulb without high-grade stenosis. Normal waveforms and color Doppler signal throughout. RIGHT VERTEBRAL ARTERY:  Normal flow direction and waveform. LEFT CAROTID ARTERY: Eccentric calcified plaque in the bulb and proximal ICA, resulting in at least mild stenosis. Normal waveforms and color Doppler signal throughout. LEFT VERTEBRAL ARTERY:  Normal flow direction and waveform. 1.3 cm left thyroid  complex cyst incidentally noted. IMPRESSION: 1. Bilateral carotid bifurcation plaque resulting in less than 50% diameter ICA stenosis. 2. Antegrade bilateral vertebral arterial flow. Electronically Signed   By: Nicoletta Barrier M.D.   On: 02/21/2024 14:30    Microbiology: Results for orders placed or performed during the hospital encounter of 05/29/20  SARS CORONAVIRUS 2 (TAT 6-24 HRS) Nasopharyngeal Nasopharyngeal Swab     Status: None   Collection Time: 05/29/20  1:15 PM   Specimen: Nasopharyngeal Swab  Result Value Ref Range Status   SARS Coronavirus 2 NEGATIVE NEGATIVE Final    Comment: (NOTE) SARS-CoV-2 target nucleic acids are NOT DETECTED.  The SARS-CoV-2 RNA is generally detectable in upper and lower respiratory specimens during the acute phase of infection. Negative results do not preclude SARS-CoV-2  infection, do not rule out co-infections with other pathogens, and should not be used as the sole basis for treatment or other patient management decisions. Negative results must be combined with clinical observations, patient history, and epidemiological information. The expected result is Negative.  Fact Sheet for Patients: HairSlick.no  Fact Sheet for Healthcare Providers: quierodirigir.com  This test is not yet approved or cleared by the [place]  FDA and  has been authorized for detection and/or diagnosis of SARS-CoV-2 by FDA under an Emergency Use Authorization (EUA). This EUA will remain  in effect (meaning this test can be used) for the duration of the COVID-19 declaration under Se ction 564(b)(1) of the Act, 21 U.S.C. section 360bbb-3(b)(1), unless the authorization is terminated or revoked sooner.  Performed at [facility], [address], [place], [place] [address]     Labs: CBC: Recent Labs  Lab 02/20/24 2024 02/21/24 0338  WBC 5.0 5.1  NEUTROABS 3.6  --   HGB 12.6 11.6*  HCT 37.6 36.1  MCV 89.3 90.5  PLT 261 247   Basic Metabolic Panel: Recent Labs  Lab 02/20/24 2024 02/21/24 0124 02/21/24 0338  NA 137  --  139  K 3.6  --  3.7  CL 104  --  105  CO2 25  --  27  GLUCOSE 114*  --  105*  BUN 16  --  14  CREATININE 0.99  --  0.86  CALCIUM  9.7  --  9.5  MG  --  2.0  --   PHOS  --  3.4  --    Liver Function Tests: Recent Labs  Lab 02/20/24 2024 02/21/24 0338  AST 20 16  ALT 11 9  ALKPHOS 82 76  BILITOT 0.5 0.4  PROT 6.5 6.0*  ALBUMIN 3.7 3.5   CBG: No results for input(s): "GLUCAP" in the last 168 hours.  Discharge time spent: less than 30 minutes.  Signed: Justina Oman, MD [facility] 02/21/2024

## 2024-02-21 NOTE — Progress Notes (Signed)
   02/21/24 1249  TOC Brief Assessment  Insurance and Status Reviewed  Patient has primary care physician Yes  Home environment has been reviewed Home alone  Prior level of function: independent  Prior/Current Home Services No current home services  Social Drivers of Health Review SDOH reviewed no interventions necessary  Readmission risk has been reviewed Yes  Transition of care needs no transition of care needs at this time   Transition of Care Department [facility]) has reviewed patient and no TOC needs have been identified at this time. We will continue to monitor patient advancement through interdisciplinary progression rounds. If new patient transition needs arise, please place a TOC consult.

## 2024-02-21 NOTE — Plan of Care (Signed)

## 2024-02-21 NOTE — Progress Notes (Signed)
*  PRELIMINARY RESULTS* Echocardiogram 2D Echocardiogram has been performed.  Bernis Brisker 02/21/2024, 3:55 PM

## 2024-02-21 NOTE — Plan of Care (Signed)
  Problem: Health Behavior/Discharge Planning: Goal: Ability to manage health-related needs will improve Outcome: Progressing   Problem: Clinical Measurements: Goal: Ability to maintain clinical measurements within normal limits will improve Outcome: Progressing Goal: Diagnostic test results will improve Outcome: Progressing Goal: Cardiovascular complication will be avoided Outcome: Progressing   Problem: Activity: Goal: Risk for activity intolerance will decrease Outcome: Progressing   Problem: Elimination: Goal: Will not experience complications related to urinary retention Outcome: Progressing   Problem: Pain Managment: Goal: General experience of comfort will improve and/or be controlled Outcome: Progressing   Problem: Safety: Goal: Ability to remain free from injury will improve Outcome: Progressing   Problem: Skin Integrity: Goal: Risk for impaired skin integrity will decrease Outcome: Progressing

## 2024-02-22 ENCOUNTER — Telehealth: Payer: Self-pay | Admitting: *Deleted

## 2024-02-22 DIAGNOSIS — Z08 Encounter for follow-up examination after completed treatment for malignant neoplasm: Secondary | ICD-10-CM | POA: Diagnosis not present

## 2024-02-22 DIAGNOSIS — Z85828 Personal history of other malignant neoplasm of skin: Secondary | ICD-10-CM | POA: Diagnosis not present

## 2024-02-22 DIAGNOSIS — L820 Inflamed seborrheic keratosis: Secondary | ICD-10-CM | POA: Diagnosis not present

## 2024-02-22 DIAGNOSIS — R002 Palpitations: Secondary | ICD-10-CM

## 2024-02-22 DIAGNOSIS — D225 Melanocytic nevi of trunk: Secondary | ICD-10-CM | POA: Diagnosis not present

## 2024-02-22 NOTE — Telephone Encounter (Signed)
-----   Message from [facility] sent at 02/21/2024  4:53 PM EDT ----- Regarding: outpatient event monitoring Please arrange for 30 days event monitoring secondary to ongoing syncope and palpitations.   Thanks

## 2024-02-22 NOTE — Telephone Encounter (Signed)
 Pt enrolled in [employer] and order placed.

## 2024-02-29 DIAGNOSIS — R55 Syncope and collapse: Secondary | ICD-10-CM | POA: Diagnosis not present

## 2024-02-29 DIAGNOSIS — R002 Palpitations: Secondary | ICD-10-CM | POA: Diagnosis not present

## 2024-03-01 ENCOUNTER — Ambulatory Visit: Attending: Cardiology

## 2024-03-01 DIAGNOSIS — R55 Syncope and collapse: Secondary | ICD-10-CM | POA: Diagnosis not present

## 2024-03-01 DIAGNOSIS — I10 Essential (primary) hypertension: Secondary | ICD-10-CM | POA: Diagnosis not present

## 2024-03-19 ENCOUNTER — Telehealth: Payer: Self-pay | Admitting: Internal Medicine

## 2024-03-19 DIAGNOSIS — E782 Mixed hyperlipidemia: Secondary | ICD-10-CM

## 2024-03-19 NOTE — Telephone Encounter (Signed)
Please advise lab orders.

## 2024-03-19 NOTE — Telephone Encounter (Signed)
 Order placed and pt contacted and advised.

## 2024-03-19 NOTE — Telephone Encounter (Signed)
 Pt called in and stated she has a Lipid appt 8/22 with [facility] and would like to go to the [facility] in [place] prior to the appt.    Best number for pt [phone]

## 2024-03-26 ENCOUNTER — Ambulatory Visit: Attending: Cardiovascular Disease | Admitting: Cardiovascular Disease

## 2024-03-26 ENCOUNTER — Encounter: Payer: Self-pay | Admitting: Cardiovascular Disease

## 2024-03-26 VITALS — BP 120/70 | HR 58 | Ht 64.0 in | Wt 162.8 lb

## 2024-03-26 DIAGNOSIS — R55 Syncope and collapse: Secondary | ICD-10-CM

## 2024-03-26 NOTE — Progress Notes (Signed)
 03/26/2024 Anna Gibson   [birth year]  [id number]  Primary Physician Anna Rush, MD Primary Cardiologist: Anna JINNY Lesches MD Anna Gibson, FSCAI  HPI:  Anna Gibson is a 78 y.o.   moderately overweight widowed Caucasian female mother of 2, grandmother of 1 grandchild who is accompanied by her daughter-in-law Anna Gibson today.  Unfortunately, her husband of 58 years passed away suddenly November 20, 2023.  She is referred back by Dr. Marvine for evaluation of atypical chest pain and palpitations.  I last saw her in the office 02/06/2024.  Risk factors include treated hypertension, untreated hyperlipidemia because of statin intolerance and family history with a brother who has had stents.  She did have a cardiac catheterization performed because of chest pain by myself 09/29/2011 which was entirely normal.  She gets chest pain on a daily basis as well as palpitations.  She does admit to being under a lot of stress.   I did get an event monitor that showed frequent PACs, PVCs, short runs of SVT and nonsustained ventricular tachycardia although her palpitations have improved significantly since I saw her.  Likewise, her chest pain has improved as well.  I did get a coronary CTA that showed a coronary calcium  score of 184 with minimal/nonobstructive CAD.  She was placed on Repatha  which has caused some discomfort in her lower extremities.   I referred her to Dr. Katharina lipid clinic who unfortunately was unable to find a lipid lowering therapy that she could tolerate.  Her most recent lipid profile performed 10/19/2023 revealed total cholesterol 206, LDL 117 and HDL of 59.  Since I saw her a month and a half ago she was hospitalized at [facility] on 02/20/2024 because of syncope.  She apparently was on a riding mower out in the heat and lost consciousness.  She was discharged home the following day.  2D echo was unremarkable as were carotid Dopplers.  She is currently wearing a Zio patch.  She has had  no recurrent symptoms.   Current Meds  Medication Sig   acetaminophen  (TYLENOL ) 650 MG CR tablet Take 650-1,300 mg by mouth daily as needed for pain.    ALPRAZolam  (XANAX ) 1 MG tablet Take 1 mg by mouth at bedtime.   bisoprolol -hydrochlorothiazide  (ZIAC ) 10-6.25 MG tablet Take 1 tablet by mouth daily.   Cholecalciferol (VITAMIN D-3) 125 MCG (5000 UT) TABS Take 5,000 Units by mouth daily.   Magnesium  400 MG TABS Take 400 mg by mouth daily.    pantoprazole  (PROTONIX ) 40 MG tablet 1 PO 30 MINS PRIOR TO YOUR FIRST MEAL   Probiotic Product (PROBIOTIC DAILY PO) Take 1 tablet by mouth daily.   psyllium (METAMUCIL) 58.6 % powder Take 1 packet by mouth daily in the afternoon. SUGAR FREE-ORANGE FLAVORED     Allergies  Allergen Reactions   Gabapentin  Other (See Comments)    Dizzy spacy    Indomethacin Other (See Comments)    Severe headache   Repatha  [Evolocumab ] Other (See Comments)    Unknown    Crestor  [Rosuvastatin ] Other (See Comments)    Sick and myalgias   Leqvio  [Inclisiran Sodium ] Other (See Comments)    Dizziness    Lipitor [Atorvastatin] Other (See Comments)    Myalgias    Zetia  [Ezetimibe ] Other (See Comments)    Fatigue     Social History   Socioeconomic History   Marital status: Married    Spouse name: Not on file   Number of children: Not on file  Years of education: Not on file   Highest education level: Not on file  Occupational History   Not on file  Tobacco Use   Smoking status: Never   Smokeless tobacco: Never  Vaping Use   Vaping status: Never Used  Substance and Sexual Activity   Alcohol use: No   Drug use: No   Sexual activity: Yes  Other Topics Concern   Not on file  Social History Narrative   Not on file   Social Drivers of Health   Financial Resource Strain: Not on file  Food Insecurity: No Food Insecurity (02/20/2024)   Hunger Vital Sign    Worried About Running Out of Food in the Last Year: Never true    Ran Out of Food in the Last  Year: Never true  Transportation Needs: No Transportation Needs (02/20/2024)   PRAPARE - Administrator, Civil Service (Medical): No    Lack of Transportation (Non-Medical): No  Physical Activity: Not on file  Stress: Not on file  Social Connections: Socially Isolated (02/20/2024)   Social Connection and Isolation Panel    Frequency of Communication with Friends and Family: More than three times a week    Frequency of Social Gatherings with Friends and Family: More than three times a week    Attends Religious Services: Never    Database administrator or Organizations: No    Attends Banker Meetings: Never    Marital Status: Widowed  Intimate Partner Violence: Not At Risk (02/20/2024)   Humiliation, Afraid, Rape, and Kick questionnaire    Fear of Current or Ex-Partner: No    Emotionally Abused: No    Physically Abused: No    Sexually Abused: No     Review of Systems: General: negative for chills, fever, night sweats or weight changes.  Cardiovascular: negative for chest pain, dyspnea on exertion, edema, orthopnea, palpitations, paroxysmal nocturnal dyspnea or shortness of breath Dermatological: negative for rash Respiratory: negative for cough or wheezing Urologic: negative for hematuria Abdominal: negative for nausea, vomiting, diarrhea, bright red blood per rectum, melena, or hematemesis Neurologic: negative for visual changes, syncope, or dizziness All other systems reviewed and are otherwise negative except as noted above.    Blood pressure 120/70, pulse (!) 58, height 5' 4 (1.626 m), weight 162 lb 12.8 oz (73.8 kg), SpO2 97%.  General appearance: alert and no distress Neck: no adenopathy, no carotid bruit, no JVD, supple, symmetrical, trachea midline, and thyroid  not enlarged, symmetric, no tenderness/mass/nodules Lungs: clear to auscultation bilaterally Heart: regular rate and rhythm, S1, S2 normal, no murmur, click, rub or gallop Extremities:  extremities normal, atraumatic, no cyanosis or edema Pulses: 2+ and symmetric Skin: Skin color, texture, turgor normal. No rashes or lesions Neurologic: Grossly normal  EKG not performed today      ASSESSMENT AND PLAN:   Syncope Patient had episode of syncope while riding on a riding mower out in the heat.  She was hospitalized on 02/20/2024 and discharged the next day.  Her workup was unrevealing including normal carotid Dopplers, 2D echo that showed no abnormalities.  She is wearing a Zio patch.  She has had no recurrent symptoms.  I suspect she was just dehydrated.  Talked about staying hydrated and not being out in the heat.  Will wait to see what her Zio patch shows but I suspect that will be within normal limits.     Anna Anna Lesches MD FACP,FACC,FAHA, [facility] 03/26/2024 3:34 PM

## 2024-03-26 NOTE — Patient Instructions (Signed)
 Medication Instructions:  Your physician recommends that you continue on your current medications as directed. Please refer to the Current Medication list given to you today.  *If you need a refill on your cardiac medications before your next appointment, please call your pharmacy*   Follow-Up: At [facility], you and your health needs are our priority.  As part of our continuing mission to provide you with exceptional heart care, our providers are all part of one team.  This team includes your primary Cardiologist (physician) and Advanced Practice Providers or APPs (Physician Assistants and Nurse Practitioners) who all work together to provide you with the care you need, when you need it.  Your next appointment:   6 month(s)  Provider:   Marcie Sever, PA-C, Callie Goodrich, PA-C, Kathleen Johnson, PA-C, Marlana Silvan, NP, or Katlyn West, NP         Then, Lauro Portal, MD will plan to see you again in 12 month(s).     We recommend signing up for the patient portal called MyChart.  Sign up information is provided on this After Visit Summary.  MyChart is used to connect with patients for Virtual Visits (Telemedicine).  Patients are able to view lab/test results, encounter notes, upcoming appointments, etc.  Non-urgent messages can be sent to your provider as well.   To learn more about what you can do with MyChart, go to [email or web address].

## 2024-03-26 NOTE — Assessment & Plan Note (Signed)
 Patient had episode of syncope while riding on a riding mower out in the heat.  She was hospitalized on 02/20/2024 and discharged the next day.  Her workup was unrevealing including normal carotid Dopplers, 2D echo that showed no abnormalities.  She is wearing a Zio patch.  She has had no recurrent symptoms.  I suspect she was just dehydrated.  Talked about staying hydrated and not being out in the heat.  Will wait to see what her Zio patch shows but I suspect that will be within normal limits.

## 2024-04-09 DIAGNOSIS — R002 Palpitations: Secondary | ICD-10-CM | POA: Diagnosis not present

## 2024-04-09 DIAGNOSIS — R55 Syncope and collapse: Secondary | ICD-10-CM

## 2024-04-29 DIAGNOSIS — M5416 Radiculopathy, lumbar region: Secondary | ICD-10-CM | POA: Diagnosis not present

## 2024-05-07 DIAGNOSIS — E782 Mixed hyperlipidemia: Secondary | ICD-10-CM | POA: Diagnosis not present

## 2024-05-08 LAB — LIPID PANEL
Chol/HDL Ratio: 3.4 ratio (ref 0.0–4.4)
Cholesterol, Total: 212 mg/dL — ABNORMAL HIGH (ref 100–199)
HDL: 62 mg/dL (ref >39)
LDL Chol Calc (NIH): 124 mg/dL — ABNORMAL HIGH (ref 0–99)
Triglycerides: 149 mg/dL (ref 0–149)
VLDL Cholesterol Cal: 26 mg/dL (ref 5–40)

## 2024-05-09 ENCOUNTER — Ambulatory Visit (HOSPITAL_BASED_OUTPATIENT_CLINIC_OR_DEPARTMENT_OTHER): Payer: Self-pay | Admitting: Internal Medicine

## 2024-05-10 ENCOUNTER — Ambulatory Visit: Attending: Internal Medicine | Admitting: Internal Medicine

## 2024-05-10 ENCOUNTER — Encounter: Payer: Self-pay | Admitting: Internal Medicine

## 2024-05-10 VITALS — BP 120/80 | HR 60 | Ht 64.0 in | Wt 162.0 lb

## 2024-05-10 DIAGNOSIS — E782 Mixed hyperlipidemia: Secondary | ICD-10-CM

## 2024-05-10 DIAGNOSIS — T466X5D Adverse effect of antihyperlipidemic and antiarteriosclerotic drugs, subsequent encounter: Secondary | ICD-10-CM | POA: Diagnosis not present

## 2024-05-10 DIAGNOSIS — E785 Hyperlipidemia, unspecified: Secondary | ICD-10-CM | POA: Diagnosis not present

## 2024-05-10 DIAGNOSIS — M791 Myalgia, unspecified site: Secondary | ICD-10-CM | POA: Diagnosis not present

## 2024-05-10 DIAGNOSIS — I2510 Atherosclerotic heart disease of native coronary artery without angina pectoris: Secondary | ICD-10-CM | POA: Diagnosis not present

## 2024-05-10 NOTE — Progress Notes (Signed)
 LIPID CLINIC CONSULT NOTE  Chief Complaint:  Follow-up dyslipidemia  Primary Care Physician: Anna Rush, MD  Primary Cardiologist:  None  HPI:  Anna Gibson is a 78 y.o. female who is being seen today for the evaluation of dyslipidemia at the request of Anna Dorn PARAS, MD. this is a pleasant 78 year old female kindly referred for evaluation management of dyslipidemia by Anna Gibson.  She has coronary artery disease based on elevated coronary calcium  with mild obstruction on the CT scan (ASCVD).  She also has risk factors including hypertension.  She had been on statin therapy particularly rosuvastatin  which caused her myalgias.  More recently she was started on Repatha  by our hypertension clinic pharmacist however she said she had side effects with that including worsening lower extremity pain.  She discontinued it and said this is improved.  She is here to consider other options  02/03/2022  [name] returns today for follow-up.  She has been on the ezetimibe  but unfortunately she has had worsening symptoms associated with it.  Primarily she has been fatigued.  She said it was severe at 1 point and had trouble getting out of bed.  She stopped it for 2 weeks and noticed a marked improvement and then restarted it.  Symptoms then returned and she has been taking it every 3 days.  She still feels fatigued.  Repeat lipids were just performed.  Total cholesterol now 204, triglycerides 174, HDL 58 and LDL 116 which is actually slightly higher.  She had similar side effects on Repatha  which caused significant fatigue the lasted for about a week at a time.  She would just get better and then had to take another dose.  She could not tolerate statins because of myalgias.  Her options are very limited at this point.  She does have coronary disease and aortic atherosclerosis.  11/10/2022  Anna Gibson returns today for follow-up of dyslipidemia.  Due to her multiple medication intolerances,  ultimately we were able to arrange for therapy with Leqvio .  After extensive paperwork the medication was available to her at a very reasonable cost.  She underwent the first and second injections over the past 4 months.  She says that she developed symptoms 2 weeks after the first injection including dizziness, lightheadedness and soreness in multiple joints.  Subsequently after her recent injection (the 72-month dose) which was in December, she developed worsening symptoms the next day.  She said this continues to persist now 2 months later.  She is not interested in continuing with this therapy and says that she generally does not tolerate any medications well.  Despite this she has had improvement in her lipids with total cholesterol down to 177 from 204 and decrease in her LDL to 87 from 116.  There is been a small increase in triglycerides and this is likely due to worsening ability to be active, feels in part due to side effects from the medication.  04/12/2023  Anna Gibson is seen today in follow-up.  Unfortunately she says that she felt horrible on Leqvio  and felt very weak and tired.  She ultimately requested to stop the injections which were scheduled to be this month.  Her cholesterol ready is starting to climb up.  Her LDL was 87 now up to 100, total cholesterol 191, triglycerides 180 and HDL 60.  I did test an LP(a) on her which not surprisingly was elevated at 194.9 nmol/L.  We talked about the genetic nature of this and how the  levels are not affected by diet, exercise or weight loss.  It does confer increased risk of cardiovascular disease.  Her options are very limited having failed statins, ezetimibe , PCSK9 inhibitors and Leqvio  at this point.  We did discuss the possibility of bempedoic acid however she is not interested in additional therapies at this time.  05/10/2024  Anna Gibson is seen today in follow-up.  She was previously seen about a year ago and follow-up with left as needed because  she had essentially exhausted all of her lipid-lowering needs except for bempedoic acid.  She said she was not interested at the time and still has some concerns about the medication.  We talked about some other dietary lifestyle modifications.  She did actually lose a good bit of weight recently.  Recent lipids however have gone up off of therapy now with total cholesterol 212, triglycerides 149, HDL 62 and LDL 124.  PMHx:  Past Medical History:  Diagnosis Date   Abdominal hernia 05/10/2022   Burning sensation of mouth 08/13/2014   Chest pain 09/29/2011   Atypical chest pain  IMO SNOMED Dx Update Oct 2024     Chronic superficial gastritis 02/20/2024   Dysphagia 01/11/2012   GERD (gastroesophageal reflux disease)    Hemorrhoids 05/06/2020   HTN (hypertension), though recently low BP 09/29/2011   Hypertension    Incomplete right bundle branch block 02/06/2024   Incomplete right bundle branch block     Palpitations 02/23/2021   Palpitations     Vitamin D deficiency     Past Surgical History:  Procedure Laterality Date   BIOPSY  09/21/2018   Procedure: BIOPSY;  Surgeon: Shaaron Lamar HERO, MD;  Location: [facility];  Service: Endoscopy;;  gastric   CARDIAC CATHETERIZATION     CESAREAN SECTION     CESAREAN SECTION     CHOLECYSTECTOMY     COLONOSCOPY  08/14/2007   Rare sigmoid colon diverticula.  Random colon biopsies benign.  Repeat colonoscopy in 10 years.   ESOPHAGOGASTRODUODENOSCOPY  08/14/2007    2 cm hiatal hernia, multiple sessile gastric polyps in the fundus, multiple erosions in the antrum, normal duodenum/Gastritis is the likely cause for her abdominal pain.  No H. pylori or celiac sprue.   ESOPHAGOGASTRODUODENOSCOPY N/A 09/21/2018   Dr. Shaaron: mild erosive reflux esophagitis, few gastric polyps s/p resection and retrieval, normal duodenum, fundic gland polyp   FLEXIBLE SIGMOIDOSCOPY N/A 12/30/2016   non-thrombosed external hemorrhoids, redundant rectosigmoid colon, internal  hemorrhoids   LEFT HEART CATHETERIZATION WITH CORONARY ANGIOGRAM N/A 09/29/2011   Procedure: LEFT HEART CATHETERIZATION WITH CORONARY ANGIOGRAM;  Surgeon: Dorn JINNY Lesches, MD;  Location: [facility] CATH LAB;  Service: Cardiovascular;  Laterality: N/A;    FAMHx:  Family History  Problem Relation Age of Onset   Congenital heart disease Mother    Coronary artery disease Brother    Colon cancer Neg Hx     SOCHx:   reports that she has never smoked. She has never used smokeless tobacco. She reports that she does not drink alcohol and does not use drugs.  ALLERGIES:  Allergies  Allergen Reactions   Gabapentin  Other (See Comments)    Dizzy spacy    Indomethacin Other (See Comments)    Severe headache   Repatha  [Evolocumab ] Other (See Comments)    Unknown    Crestor  [Rosuvastatin ] Other (See Comments)    Sick and myalgias   Leqvio  [Inclisiran Sodium ] Other (See Comments)    Dizziness    Lipitor [Atorvastatin] Other (See  Comments)    Myalgias    Zetia  [Ezetimibe ] Other (See Comments)    Fatigue     ROS: Pertinent items noted in HPI and remainder of comprehensive ROS otherwise negative.  HOME MEDS: Current Outpatient Medications on File Prior to Visit  Medication Sig Dispense Refill   acetaminophen  (TYLENOL ) 650 MG CR tablet Take 650-1,300 mg by mouth daily as needed for pain.      ALPRAZolam  (XANAX ) 1 MG tablet Take 1 mg by mouth at bedtime.     bisoprolol -hydrochlorothiazide  (ZIAC ) 10-6.25 MG tablet Take 1 tablet by mouth daily. 90 tablet 3   Cholecalciferol (VITAMIN D-3) 125 MCG (5000 UT) TABS Take 5,000 Units by mouth daily.     Magnesium  400 MG TABS Take 400 mg by mouth daily.      pantoprazole  (PROTONIX ) 40 MG tablet 1 PO 30 MINS PRIOR TO YOUR FIRST MEAL 90 tablet 3   Potassium 99 MG TABS Take by mouth.     Probiotic Product (PROBIOTIC DAILY PO) Take 1 tablet by mouth daily.     psyllium (METAMUCIL) 58.6 % powder Take 1 packet by mouth daily in the afternoon. SUGAR FREE-ORANGE  FLAVORED     No current facility-administered medications on file prior to visit.    LABS/IMAGING: No results found for this or any previous visit (from the past 48 hours). No results found.  LIPID PANEL:    Component Value Date/Time   CHOL 212 (H) 05/07/2024 0954   TRIG 149 05/07/2024 0954   HDL 62 05/07/2024 0954   CHOLHDL 3.4 05/07/2024 0954   CHOLHDL 3.2 09/29/2011 0936   VLDL 16 09/29/2011 0936   LDLCALC 124 (H) 05/07/2024 0954    WEIGHTS: Wt Readings from Last 3 Encounters:  05/10/24 162 lb (73.5 kg)  03/26/24 162 lb 12.8 oz (73.8 kg)  02/20/24 167 lb 8.8 oz (76 kg)    VITALS: BP 120/80   Pulse 60   Ht 5' 4 (1.626 m)   Wt 162 lb (73.5 kg)   SpO2 97%   BMI 27.81 kg/m   EXAM: Deferred  EKG: N/A  ASSESSMENT: Mixed dyslipidemia, goal LDL less than 70 Statin intolerant-myalgias Repatha  intolerant-myalgias Ezetimibe  intolerance-fatigue Leqvio  intolerance-fatigue, weakness Elevated LP(a) at 194.9 nmol/L  PLAN: 1.   Anna Gibson has basically exhausted all options for lipid-lowering therapies except for bempedoic acid.  She has had weight loss and this may have curbed her rising cholesterol somewhat however her LDL now at 124.  We talked about some over-the-counter remedies that might be of benefit.  What is red yeast rice.  This could of course lead to myalgias which she is aware of but might be tolerated and give some clinical benefit.  She wants to try that.  Will order repeat lipids in a few months.  Follow-up with me as needed.  Vinie KYM Maxcy, MD, [facility], FNLA, FACP    [facility]  Medical Director of the Advanced Lipid Disorders &  Cardiovascular Risk Reduction Clinic Diplomate of the American Board of Clinical Lipidology Attending Cardiologist  Direct Dial: [phone]  Fax: [phone]  Website:  [email or web address]   Vinie BROCKS Tyreisha Ungar 05/10/2024, 8:11 AM

## 2024-05-10 NOTE — Patient Instructions (Signed)
 Medication Instructions:  START red yeast rice as directed  *If you need a refill on your cardiac medications before your next appointment, please call your pharmacy*  Lab Work: FASTING lab work in 3 months to check cholesterol   If you have labs (blood work) drawn today and your tests are completely normal, you will receive your results only by: MyChart Message (if you have MyChart) OR A paper copy in the mail If you have any lab test that is abnormal or we need to change your treatment, we will call you to review the results.   Follow-Up: At [facility], you and your health needs are our priority.  As part of our continuing mission to provide you with exceptional heart care, our providers are all part of one team.  This team includes your primary Cardiologist (physician) and Advanced Practice Providers or APPs (Physician Assistants and Nurse Practitioners) who all work together to provide you with the care you need, when you need it.  Your next appointment:    AS NEEDED with Dr. Mona  We recommend signing up for the patient portal called MyChart.  Sign up information is provided on this After Visit Summary.  MyChart is used to connect with patients for Virtual Visits (Telemedicine).  Patients are able to view lab/test results, encounter notes, upcoming appointments, etc.  Non-urgent messages can be sent to your provider as well.   To learn more about what you can do with MyChart, go to [email or web address].

## 2024-06-12 ENCOUNTER — Encounter: Payer: Self-pay | Admitting: *Deleted

## 2024-07-25 ENCOUNTER — Telehealth: Payer: Self-pay | Admitting: Cardiovascular Disease

## 2024-07-25 NOTE — Telephone Encounter (Signed)
 Pt would like to switch from Dr Court to Dr Mallipeddi in [place]. She is having a hard time getting someone to bring her and it would be easier to get to the [place] office. Please advise

## 2024-08-14 ENCOUNTER — Other Ambulatory Visit (HOSPITAL_COMMUNITY): Payer: Self-pay

## 2024-08-14 DIAGNOSIS — R634 Abnormal weight loss: Secondary | ICD-10-CM
